# Patient Record
Sex: Male | Born: 1955
Health system: Southern US, Community
[De-identification: ages and names within clinical notes are randomized; demographics above are authoritative.]

## PROBLEM LIST (undated history)

## (undated) DIAGNOSIS — I10 Essential (primary) hypertension: Secondary | ICD-10-CM

## (undated) DIAGNOSIS — E785 Hyperlipidemia, unspecified: Secondary | ICD-10-CM

## (undated) DIAGNOSIS — B019 Varicella without complication: Secondary | ICD-10-CM

## (undated) DIAGNOSIS — Z8639 Personal history of other endocrine, nutritional and metabolic disease: Secondary | ICD-10-CM

## (undated) DIAGNOSIS — B269 Mumps without complication: Secondary | ICD-10-CM

## (undated) HISTORY — DX: Hyperlipidemia, unspecified: E78.5

## (undated) HISTORY — DX: Essential (primary) hypertension: I10

## (undated) HISTORY — DX: Varicella without complication: B01.9

## (undated) HISTORY — DX: Mumps without complication: B26.9

## (undated) HISTORY — DX: Personal history of other endocrine, nutritional and metabolic disease: Z86.39

---

## 1992-09-12 HISTORY — PX: WISDOM TOOTH EXTRACTION: SHX21

## 1996-09-12 HISTORY — PX: ROOT CANAL: SHX2363

## 2000-10-21 ENCOUNTER — Emergency Department (HOSPITAL_COMMUNITY): Admission: EM | Admit: 2000-10-21 | Discharge: 2000-10-21 | Payer: Self-pay | Admitting: Emergency Medicine

## 2000-10-26 ENCOUNTER — Emergency Department (HOSPITAL_COMMUNITY): Admission: EM | Admit: 2000-10-26 | Discharge: 2000-10-26 | Payer: Self-pay | Admitting: Emergency Medicine

## 2000-10-31 ENCOUNTER — Emergency Department (HOSPITAL_COMMUNITY): Admission: EM | Admit: 2000-10-31 | Discharge: 2000-10-31 | Payer: Self-pay | Admitting: Emergency Medicine

## 2002-12-22 ENCOUNTER — Emergency Department (HOSPITAL_COMMUNITY): Admission: EM | Admit: 2002-12-22 | Discharge: 2002-12-22 | Payer: Self-pay | Admitting: Emergency Medicine

## 2009-09-12 HISTORY — PX: COLONOSCOPY: SHX174

## 2011-04-08 ENCOUNTER — Ambulatory Visit (INDEPENDENT_AMBULATORY_CARE_PROVIDER_SITE_OTHER): Payer: BC Managed Care – PPO | Admitting: Family

## 2011-04-08 ENCOUNTER — Encounter: Payer: Self-pay | Admitting: Family

## 2011-04-08 DIAGNOSIS — Z862 Personal history of diseases of the blood and blood-forming organs and certain disorders involving the immune mechanism: Secondary | ICD-10-CM

## 2011-04-08 DIAGNOSIS — Z8639 Personal history of other endocrine, nutritional and metabolic disease: Secondary | ICD-10-CM | POA: Insufficient documentation

## 2011-04-08 DIAGNOSIS — I10 Essential (primary) hypertension: Secondary | ICD-10-CM

## 2011-04-08 LAB — BASIC METABOLIC PANEL
BUN: 17 mg/dL (ref 6–23)
Calcium: 9.5 mg/dL (ref 8.4–10.5)
Glucose, Bld: 89 mg/dL (ref 70–99)
Sodium: 141 mEq/L (ref 135–145)

## 2011-04-08 LAB — TSH: TSH: 1.372 u[IU]/mL (ref 0.350–4.500)

## 2011-04-08 NOTE — Assessment & Plan Note (Signed)
Will check TSH today. Apparently he has been euthyroid for the last 9 years per history.

## 2011-04-08 NOTE — Assessment & Plan Note (Signed)
He has not taken meds in 3 days. BP looks good.  He wishes to try to come off BP meds completely.  I recommended to him that he continue a low sodium diet, try to increase his exercise.  Follow up in 6 weeks for a complete physical and BP check.

## 2011-04-08 NOTE — Progress Notes (Signed)
  Subjective:    Patient ID: Joel Chen, male    DOB: 03-Feb-1956, 55 y.o.   MRN: [id number]  HPI  Mr.  Joel Chen is a 55 yr old male who presents today to establish care. Was followed at [facility].    Patient presents today for followup of hypertension.  Patient has been treated for Chronic HTN for quite sometime. He is currently on quinapril, though notes that he has not taken his medication in 3 days.   No associated S/S related to HTN. Quality: chronic Modifying factor: meds Duration: Quite sometime Associated S/S: None.  The patient denies the following associated symptoms: Chest pain, dyspnea, blurred vision, headache, or lower extremity edema.  Hyperthyroidism- was treated about 10 yrs ago.  ? On tapazole- for about a year.  Pt does not remember the name of the pill.  Had physical at [facility]    Review of Systems  Constitutional: Negative for fever and unexpected weight change.  HENT: Negative for hearing loss.   Eyes: Negative for visual disturbance.  Respiratory: Negative for shortness of breath.   Cardiovascular: Negative for chest pain and leg swelling.  Gastrointestinal: Negative for nausea, vomiting and diarrhea.  Genitourinary: Negative for dysuria, urgency and decreased urine volume.  Musculoskeletal: Negative for myalgias and arthralgias.  Skin: Negative for rash.       Mole on his side, unchanged  Neurological: Negative for headaches.  Hematological: Negative for adenopathy.  Psychiatric/Behavioral:       Denies depression or anxiety      Objective:   Physical Exam  Constitutional: He appears well-developed and well-nourished.  HENT:  Head: Normocephalic and atraumatic.  Eyes: Conjunctivae are normal. Pupils are equal, round, and reactive to light.  Neck: No tracheal deviation present. No thyromegaly present.  Cardiovascular: Normal rate and regular rhythm.   Pulmonary/Chest: Effort normal and breath sounds normal.  Abdominal: Soft. Bowel sounds  are normal. He exhibits no distension. There is no tenderness.  Musculoskeletal: He exhibits no edema.  Skin: Skin is warm and dry.  Psychiatric: He has a normal mood and affect. His behavior is normal. Judgment and thought content normal.          Assessment & Plan:

## 2011-04-08 NOTE — Patient Instructions (Addendum)
Please schedule a complete physical in 6 weeks. Come fasting to this appointment.

## 2011-04-10 ENCOUNTER — Encounter: Payer: Self-pay | Admitting: Family

## 2011-04-22 ENCOUNTER — Telehealth: Payer: Self-pay | Admitting: *Deleted

## 2011-04-22 NOTE — Telephone Encounter (Signed)
Received records from [facility] and forwarded to Provider for review.

## 2011-04-29 ENCOUNTER — Encounter: Payer: Self-pay | Admitting: Family

## 2011-04-29 DIAGNOSIS — E785 Hyperlipidemia, unspecified: Secondary | ICD-10-CM | POA: Insufficient documentation

## 2011-04-29 HISTORY — DX: Hyperlipidemia, unspecified: E78.5

## 2011-05-20 ENCOUNTER — Encounter: Payer: Self-pay | Admitting: Family

## 2011-05-20 ENCOUNTER — Ambulatory Visit (INDEPENDENT_AMBULATORY_CARE_PROVIDER_SITE_OTHER): Payer: BC Managed Care – PPO | Admitting: Family

## 2011-05-20 DIAGNOSIS — Z0000 Encounter for general adult medical examination without abnormal findings: Secondary | ICD-10-CM | POA: Insufficient documentation

## 2011-05-20 LAB — LIPID PANEL
Cholesterol: 251 mg/dL — ABNORMAL HIGH (ref 0–200)
HDL: 50 mg/dL (ref >39)
Total CHOL/HDL Ratio: 5 Ratio
Triglycerides: 131 mg/dL (ref <150)

## 2011-05-20 LAB — PSA: PSA: 2.03 ng/mL (ref <=4.00)

## 2011-05-20 LAB — CBC
Hemoglobin: 15.6 g/dL (ref 13.0–17.0)
RBC: 5.63 MIL/uL (ref 4.22–5.81)

## 2011-05-20 LAB — HEPATIC FUNCTION PANEL
Albumin: 4.4 g/dL (ref 3.5–5.2)
Total Protein: 7.4 g/dL (ref 6.0–8.3)

## 2011-05-20 MED ORDER — QUINAPRIL HCL 10 MG PO TABS: 10.0000 mg | ORAL_TABLET | ORAL | Status: DC

## 2011-05-20 NOTE — Patient Instructions (Signed)
Please complete your blood work on the first floor- we will mail you a letter about your results.   Follow up in 6 months. Try to exercise 30 minutes a day. Eat a low fat/low cholesterol diet.  Also try to limit your sodium.

## 2011-05-20 NOTE — Assessment & Plan Note (Signed)
Pt counseled on diet, exercise and weight loss.  Colo up to date,  Check fasting labs today.  Reports that his tetanus is up to date, declines flu shot today, he will get with his employer.

## 2011-05-20 NOTE — Progress Notes (Signed)
Subjective:    Patient ID: Joel Chen, male    DOB: July 28, 1956, 55 y.o.   MRN: [id number]  HPI  Mr.  Joel Chen is a 55 yr old male who presents today for a complete physical. Reports that his last tetanus was <10 yrs ago.  Will get flu shot from work. Colo was last year.  Normal.  Exercise-  Active with his job, wants to restart a formal exercise routine- has bike at home, treadmill, rowing machine.  Diet- AM (egg/toast/sausage juice (some days), dinner- "cuts back on a lot of stuff."     Review of Systems  Constitutional: Negative for fever and unexpected weight change.  HENT: Negative for hearing loss.   Eyes: Negative for visual disturbance.  Respiratory: Negative for shortness of breath.   Cardiovascular: Negative for chest pain, palpitations and leg swelling.  Gastrointestinal: Negative for nausea, vomiting and abdominal pain.  Genitourinary: Negative for dysuria and frequency.  Musculoskeletal: Negative for myalgias and arthralgias.  Skin: Negative for rash.  Neurological: Negative for headaches.  Hematological: Negative for adenopathy.  Psychiatric/Behavioral:       Denies depression or anxiety   Past Medical History  Diagnosis Date  . Hypertension   . Personal history of hyperthyroidism     "pt states his hyperthyroidism went away and hasn't had to take meds in 8 yrs."  . Hyperlipidemia 04/29/2011    History   Social History  . Marital Status: Married    Spouse Name: N/A    Number of Children: 2  . Years of Education: N/A   Occupational History  .  [employer]   Social History Main Topics  . Smoking status: Never Smoker   . Smokeless tobacco: Never Used  . Alcohol Use: 8.4 oz/week    14 Cans of beer per week  . Drug Use: No  . Sexually Active: Not on file   Other Topics Concern  . Not on file   Social History Narrative   Regular exercise:  Active at [facility] use: 1 cup coffee dailyLayout Engineer at [employer]     Past Surgical  History  Procedure Date  . Root canal 1998  . Wisdom tooth extraction 1994  . Colonoscopy 09/2009    hemorrhoids    Family History  Problem Relation Age of Onset  . Heart disease Mother   . Hypertension Mother   . Cancer Father     lung    No Known Allergies  Current Outpatient Prescriptions on File Prior to Visit  Medication Sig Dispense Refill  . clobetasol (TEMOVATE) 0.05 % ointment Apply 1 application topically 2 (two) times daily.          BP 130/90  Pulse 78  Temp(Src) 97.8 F (36.6 C) (Oral)  Resp 16  Ht 5\' 6"  (1.676 m)  Wt 210 lb (95.255 kg)  BMI 33.89 kg/m2       Objective:   Physical Exam  Constitutional: He is oriented to person, place, and time. He appears well-developed and well-nourished. No distress.  HENT:  Head: Normocephalic and atraumatic.  Right Ear: Tympanic membrane and ear canal normal.  Left Ear: Tympanic membrane and ear canal normal.  Eyes: Conjunctivae are normal. Pupils are equal, round, and reactive to light. Right eye exhibits no discharge.  Neck: Normal range of motion. Neck supple.  Cardiovascular: Normal rate, regular rhythm and normal heart sounds.  Exam reveals no friction rub.   No murmur heard. Pulmonary/Chest: Effort normal and breath sounds normal. No  respiratory distress. He has no wheezes. He has no rales. He exhibits no tenderness.  Abdominal: Soft. Bowel sounds are normal. He exhibits no distension and no mass. There is no tenderness. There is no rebound and no guarding.  Genitourinary: Prostate normal. Guaiac negative stool.  Neurological: He is alert and oriented to person, place, and time. He has normal strength.  Reflex Scores:      Tricep reflexes are 2+ on the right side and 2+ on the left side.      Bicep reflexes are 2+ on the right side and 2+ on the left side.      Brachioradialis reflexes are 2+ on the right side and 2+ on the left side.      Patellar reflexes are 2+ on the right side and 2+ on the left side.       Achilles reflexes are 2+ on the right side and 2+ on the left side. Skin: Skin is warm and dry.  Psychiatric: He has a normal mood and affect. His behavior is normal. Judgment and thought content normal.          Assessment & Plan:

## 2011-05-22 ENCOUNTER — Encounter: Payer: Self-pay | Admitting: Family

## 2011-11-22 ENCOUNTER — Encounter: Payer: Self-pay | Admitting: Family

## 2011-11-22 ENCOUNTER — Ambulatory Visit (INDEPENDENT_AMBULATORY_CARE_PROVIDER_SITE_OTHER): Payer: BC Managed Care – PPO | Admitting: Family

## 2011-11-22 VITALS — BP 132/88 | HR 84 | Temp 98.2°F | Resp 16 | Ht 66.0 in | Wt 214.0 lb

## 2011-11-22 DIAGNOSIS — L989 Disorder of the skin and subcutaneous tissue, unspecified: Secondary | ICD-10-CM

## 2011-11-22 DIAGNOSIS — Z8639 Personal history of other endocrine, nutritional and metabolic disease: Secondary | ICD-10-CM

## 2011-11-22 DIAGNOSIS — I10 Essential (primary) hypertension: Secondary | ICD-10-CM

## 2011-11-22 DIAGNOSIS — Z862 Personal history of diseases of the blood and blood-forming organs and certain disorders involving the immune mechanism: Secondary | ICD-10-CM

## 2011-11-22 DIAGNOSIS — E785 Hyperlipidemia, unspecified: Secondary | ICD-10-CM

## 2011-11-22 NOTE — Progress Notes (Signed)
  Subjective:    Patient ID: Joel Chen, male    DOB: 09/18/1955, 56 y.o.   MRN: [id number]  HPI  Joel Chen is a 56 yr old male who presents today for follow up.  1) HTN- he is now off of anti-hypertensives.  2) Hyperlipidemia- reports that he has cut down on greasy foods.    3) Skin lesion- reports that he was using a steroid cream for scalp lesion rx'd by his dermatologist.  This cream was not helping, and derm retired.  Notes that lesion itches on occasion, though feels ok to day.  4) Hyperthyroid- pt has normal TSH last visit.  Was treated with tapazole about 10 yrs ago.  Review of Systems    see HPI  Past Medical History  Diagnosis Date  . Hypertension   . Personal history of hyperthyroidism     "pt states his hyperthyroidism went away and hasn't had to take meds in 8 yrs."  . Hyperlipidemia 04/29/2011    History   Social History  . Marital Status: Married    Spouse Name: N/A    Number of Children: 2  . Years of Education: N/A   Occupational History  .  [employer]   Social History Main Topics  . Smoking status: Never Smoker   . Smokeless tobacco: Never Used  . Alcohol Use: 8.4 oz/week    14 Cans of beer per week  . Drug Use: No  . Sexually Active: Not on file   Other Topics Concern  . Not on file   Social History Narrative   Regular exercise:  Active at [facility] use: 1 cup coffee dailyLayout Engineer at [employer]     Past Surgical History  Procedure Date  . Root canal 1998  . Wisdom tooth extraction 1994  . Colonoscopy 09/2009    hemorrhoids    Family History  Problem Relation Age of Onset  . Heart disease Mother   . Hypertension Mother   . Cancer Father     lung    No Known Allergies  No current outpatient prescriptions on file prior to visit.    BP 132/88  Pulse 84  Temp(Src) 98.2 F (36.8 C) (Oral)  Resp 16  Ht 5\' 6"  (1.676 m)  Wt 214 lb (97.07 kg)  BMI 34.54 kg/m2  SpO2 97%    Objective:   Physical  Exam  Constitutional: He appears well-developed and well-nourished. No distress.  Cardiovascular: Normal rate and regular rhythm.   No murmur heard. Pulmonary/Chest: Effort normal and breath sounds normal. No respiratory distress. He has no wheezes. He has no rales. He exhibits no tenderness.  Musculoskeletal: He exhibits no edema.  Skin:       1 cm flat, flesh colored lesion at base of scalp- no hair growing in this patch.   Psychiatric: He has a normal mood and affect. His behavior is normal. Judgment and thought content normal.          Assessment & Plan:

## 2011-11-22 NOTE — Patient Instructions (Signed)
Please return fasting for cholesterol check to lab.  Follow up in late September for physical.

## 2011-11-23 DIAGNOSIS — L989 Disorder of the skin and subcutaneous tissue, unspecified: Secondary | ICD-10-CM | POA: Insufficient documentation

## 2011-11-23 NOTE — Assessment & Plan Note (Signed)
BP is stable off of ACE.  Continue low sodium diet, recommended exercise and weight loss as well.

## 2011-11-23 NOTE — Assessment & Plan Note (Signed)
Will refer to dermatology

## 2011-11-23 NOTE — Assessment & Plan Note (Signed)
TSH normal last visit.  Clinically euthyroid. Monitor.

## 2011-11-23 NOTE — Assessment & Plan Note (Signed)
He will return for FLP.

## 2011-11-30 LAB — LIPID PANEL
Cholesterol: 218 mg/dL — ABNORMAL HIGH (ref 0–200)
Total CHOL/HDL Ratio: 5.2 Ratio
Triglycerides: 184 mg/dL — ABNORMAL HIGH (ref <150)

## 2011-12-01 ENCOUNTER — Encounter: Payer: Self-pay | Admitting: Family

## 2012-05-31 ENCOUNTER — Telehealth: Payer: Self-pay | Admitting: *Deleted

## 2012-05-31 NOTE — Telephone Encounter (Signed)
Received message from pt's wife stating pt has physical on 06/04/12 at 3pm. Pt will be unable to fast all day and wanted to know if he will need to have labs done at upcoming appt. Attempted to reach pt and left detailed message on home# that he may return to the lab fasting either before or after his physical appt. And to call and let us know how he would like to proceed.

## 2012-06-04 ENCOUNTER — Encounter: Payer: Self-pay | Admitting: Family

## 2012-06-04 ENCOUNTER — Ambulatory Visit (INDEPENDENT_AMBULATORY_CARE_PROVIDER_SITE_OTHER): Payer: BC Managed Care – PPO | Admitting: Family

## 2012-06-04 ENCOUNTER — Encounter: Payer: BC Managed Care – PPO | Admitting: Family

## 2012-06-04 VITALS — BP 140/88 | HR 55 | Temp 98.9°F | Resp 16 | Ht 67.5 in | Wt 215.0 lb

## 2012-06-04 DIAGNOSIS — Z23 Encounter for immunization: Secondary | ICD-10-CM

## 2012-06-04 DIAGNOSIS — Z0000 Encounter for general adult medical examination without abnormal findings: Secondary | ICD-10-CM

## 2012-06-04 LAB — LIPID PANEL: LDL Cholesterol: 153 mg/dL — ABNORMAL HIGH (ref 0–99)

## 2012-06-04 LAB — HEPATIC FUNCTION PANEL
ALT: 28 U/L (ref 0–53)
Alkaline Phosphatase: 73 U/L (ref 39–117)
Indirect Bilirubin: 0.7 mg/dL (ref 0.0–0.9)
Total Protein: 6.9 g/dL (ref 6.0–8.3)

## 2012-06-04 LAB — PSA: PSA: 2.06 ng/mL (ref <=4.00)

## 2012-06-04 LAB — BASIC METABOLIC PANEL WITH GFR
BUN: 14 mg/dL (ref 6–23)
Chloride: 103 mEq/L (ref 96–112)
Creat: 1.11 mg/dL (ref 0.50–1.35)
GFR, Est African American: 87 mL/min
Glucose, Bld: 90 mg/dL (ref 70–99)
Potassium: 4 mEq/L (ref 3.5–5.3)

## 2012-06-04 LAB — TSH: TSH: 1.282 u[IU]/mL (ref 0.350–4.500)

## 2012-06-04 NOTE — Assessment & Plan Note (Addendum)
Patient was encouraged to continue his exercise with the goal of 30 minutes 5 days a week. Obtain fasting lab work flu shot today. We discussed pros/cons to PSA screening and he would like to proceed. EKG performed today notes sinus bradycarda.

## 2012-06-04 NOTE — Patient Instructions (Addendum)
Please complete your lab work prior to leaving. Follow up in 6 months.  

## 2012-06-04 NOTE — Progress Notes (Signed)
Subjective:    Patient ID: Joel Chen, male    DOB: [birth year], 56 y.o.   MRN: [id number]  HPI  Preventative-  Reports that he has started walking push ups etc.  He has a bicycle and treadmill.  He has cut back portions and greasy foods.  Wants flu shot.    Review of Systems  Constitutional: Negative for unexpected weight change.  HENT: Negative for hearing loss and congestion.   Eyes: Negative for visual disturbance.  Respiratory: Negative for cough and shortness of breath.   Cardiovascular: Negative for chest pain.  Gastrointestinal: Negative for nausea, vomiting and diarrhea.  Genitourinary: Negative for urgency and frequency.  Musculoskeletal: Negative for myalgias and back pain.  Skin: Negative for rash.  Neurological: Negative for headaches.  Hematological: Negative for adenopathy.  Psychiatric/Behavioral:       Denies depression/anxiety   Past Medical History  Diagnosis Date  . Hypertension   . Personal history of hyperthyroidism     "pt states his hyperthyroidism went away and hasn't had to take meds in 8 yrs."  . Hyperlipidemia 04/29/2011    History   Social History  . Marital Status: Married    Spouse Name: N/A    Number of Children: 2  . Years of Education: N/A   Occupational History  .  [employer]   Social History Main Topics  . Smoking status: Never Smoker   . Smokeless tobacco: Never Used  . Alcohol Use: 3.6 oz/week    6 Cans of beer per week  . Drug Use: No  . Sexually Active: Not on file   Other Topics Concern  . Not on file   Social History Narrative   Regular exercise:  Active at work. 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 Root canal 1998  . Wisdom tooth extraction 1994  . Colonoscopy 09/2009    hemorrhoids    Family History  Problem Relation Age of Onset  . Heart disease Mother   . Hypertension Mother   . Cancer Father    lung    No Known Allergies  Current Outpatient Prescriptions on File Prior to Visit  Medication Sig Dispense Refill  . DISCONTD: quinapril (ACCUPRIL) 10 MG tablet Take 1 tablet (10 mg total) by mouth every morning.  30 tablet  5    BP 140/88  Pulse 55  Temp 98.9 F (37.2 C) (Oral)  Resp 16  Ht 5' 7.5" (1.715 m)  Wt 215 lb (97.523 kg)  BMI 33.18 kg/m2  SpO2 98%        Objective:   Physical Exam  Physical Exam  Constitutional: He is oriented to person, place, and time. He appears well-developed and well-nourished. No distress. Overweight african [place] male.   HENT:  Head: Normocephalic and atraumatic.  Right Ear: Tympanic membrane and ear canal normal.  Left Ear: Tympanic membrane and ear canal normal.  Mouth/Throat: Oropharynx is clear and moist.  Eyes: Pupils are equal, round, and reactive to light. No scleral icterus.  Neck: Normal range of motion. No thyromegaly present.  Cardiovascular: Normal rate and regular rhythm.   No murmur heard. Pulmonary/Chest: Effort normal and breath sounds normal. No respiratory distress. He has no wheezes. He has no rales. He exhibits no tenderness.  Abdominal: Soft. Bowel sounds are normal. He exhibits no distension and no mass. There is no tenderness. There is no rebound and no  guarding.  Musculoskeletal: He exhibits no edema.  Lymphadenopathy:    He has no cervical adenopathy.  Neurological: He is alert and oriented to person, place, and time. He has normal reflexes. He exhibits normal muscle tone. Coordination normal.  Skin: Skin is warm and dry.  Psychiatric: He has a normal mood and affect. His behavior is normal. Judgment and thought content normal.  GU:  Prostate is smooth, no enlargement. Heme negative stool.        Assessment & Plan:         Assessment & Plan:

## 2012-06-05 LAB — URINALYSIS, ROUTINE W REFLEX MICROSCOPIC
Leukocytes, UA: NEGATIVE
Nitrite: NEGATIVE
Specific Gravity, Urine: 1.01 (ref 1.005–1.030)
Urobilinogen, UA: 0.2 mg/dL (ref 0.0–1.0)

## 2012-06-07 ENCOUNTER — Telehealth: Payer: Self-pay | Admitting: Family

## 2012-06-07 DIAGNOSIS — E785 Hyperlipidemia, unspecified: Secondary | ICD-10-CM

## 2012-06-07 MED ORDER — SIMVASTATIN 10 MG PO TABS: 10.0000 mg | ORAL_TABLET | Freq: Every day | ORAL | Status: DC

## 2012-06-07 NOTE — Telephone Encounter (Signed)
Pls call pt and let him know that his triglycerides are improved, but his total cholesterol and LDL (bad cholesterol) remain elevated.  I would recommend a low dose statin to help decrease risk of heart attack and stroke.  Simvastatin sent to pharm.  He will need flp/lft in 6 weeks please (hyperlipidemia).  I would also like him to add a baby aspirin 81 mg once daily to decrease risk of heart attack/stroke.

## 2012-06-08 NOTE — Telephone Encounter (Signed)
LMOM with contact name & number for return call RE: results & further instructions from physician, informed to begin ASA 81mg  daily/SLS

## 2012-06-11 NOTE — Telephone Encounter (Signed)
Notified pt and he voices understanding. Future lab order placed. Copy given to the lab and mailed to pt as reminder.

## 2012-06-25 ENCOUNTER — Telehealth: Payer: Self-pay | Admitting: *Deleted

## 2012-06-25 DIAGNOSIS — E785 Hyperlipidemia, unspecified: Secondary | ICD-10-CM

## 2012-06-25 NOTE — Telephone Encounter (Signed)
Pt's wife called requesting a call back; she has a question regarding pt's medication prescribed.  Returned call [phone] at 4:03 pm left vm to return call.

## 2012-06-25 NOTE — Telephone Encounter (Signed)
Pt's wife called requesting a call back; she has a question regarding pt's medication prescribed.

## 2012-06-26 NOTE — Telephone Encounter (Signed)
Attempted to reach pt's wife, Margaretha Glassing. Left message that I will call her back tomorrow.

## 2012-06-27 NOTE — Telephone Encounter (Signed)
Attempted to reach [place] and left message to return my call.

## 2012-06-27 NOTE — Telephone Encounter (Signed)
OK, he should repeat flp in 3 months.

## 2012-06-27 NOTE — Telephone Encounter (Signed)
Spoke with pt's wife. She states that pt is concerned about possible side effects of cholesterol medication and wants to try diet and exercise first. Pt's wife states she is cooking healthier meals and they are walking daily. Pt wants to try this first and repeat labs in 6 weeks. Pt is taking ASA as instructed.  Please advise.

## 2012-07-02 NOTE — Telephone Encounter (Signed)
Left detailed message on home # re: instructions below. Lab order entered for FLP around the 2nd week of January. Copy given to the lab and mailed to pt. Advised pt to call if any questions.

## 2013-01-02 ENCOUNTER — Ambulatory Visit: Payer: BC Managed Care – PPO | Admitting: Family

## 2013-07-08 ENCOUNTER — Encounter: Payer: BC Managed Care – PPO | Admitting: Family

## 2013-07-16 ENCOUNTER — Encounter: Payer: BC Managed Care – PPO | Admitting: Family

## 2013-10-21 ENCOUNTER — Telehealth: Payer: Self-pay | Admitting: Family

## 2013-10-21 NOTE — Telephone Encounter (Signed)
That is fine with me.  His first appointment will need to be a 30-minute transfer of care appointment.  If he is wanting a complete physical, I can do that at the same time.  Therefore, he should come to appointment fasting for labs.

## 2013-10-21 NOTE — Telephone Encounter (Signed)
[facility] would [facility] to [facility], PA-C. Is this ok? Thanks!

## 2013-10-21 NOTE — Telephone Encounter (Signed)
OK with me.

## 2013-10-22 NOTE — Telephone Encounter (Signed)
Informed patient of wife that it is okay to transfer per both providers. She states that she will call patient and have him call to schedule appointment

## 2013-10-28 ENCOUNTER — Encounter: Payer: BC Managed Care – PPO | Admitting: Family

## 2013-10-31 ENCOUNTER — Encounter: Payer: BC Managed Care – PPO | Admitting: Physician Assistant

## 2013-11-13 ENCOUNTER — Ambulatory Visit (INDEPENDENT_AMBULATORY_CARE_PROVIDER_SITE_OTHER): Payer: BC Managed Care – PPO | Admitting: Physician Assistant

## 2013-11-13 ENCOUNTER — Encounter: Payer: Self-pay | Admitting: Physician Assistant

## 2013-11-13 VITALS — BP 134/86 | HR 78 | Temp 98.1°F | Resp 16 | Ht 66.0 in | Wt 209.5 lb

## 2013-11-13 DIAGNOSIS — Z0000 Encounter for general adult medical examination without abnormal findings: Secondary | ICD-10-CM

## 2013-11-13 DIAGNOSIS — E785 Hyperlipidemia, unspecified: Secondary | ICD-10-CM

## 2013-11-13 DIAGNOSIS — Z862 Personal history of diseases of the blood and blood-forming organs and certain disorders involving the immune mechanism: Secondary | ICD-10-CM

## 2013-11-13 DIAGNOSIS — I10 Essential (primary) hypertension: Secondary | ICD-10-CM

## 2013-11-13 DIAGNOSIS — Z8639 Personal history of other endocrine, nutritional and metabolic disease: Secondary | ICD-10-CM

## 2013-11-13 LAB — BASIC METABOLIC PANEL
BUN: 15 mg/dL (ref 6–23)
CHLORIDE: 104 meq/L (ref 96–112)
CO2: 29 meq/L (ref 19–32)
CREATININE: 1.07 mg/dL (ref 0.50–1.35)
Calcium: 9.3 mg/dL (ref 8.4–10.5)
GLUCOSE: 94 mg/dL (ref 70–99)
POTASSIUM: 4.6 meq/L (ref 3.5–5.3)
Sodium: 142 mEq/L (ref 135–145)

## 2013-11-13 LAB — HEPATIC FUNCTION PANEL
ALBUMIN: 4.4 g/dL (ref 3.5–5.2)
ALT: 42 U/L (ref 0–53)
AST: 29 U/L (ref 0–37)
Alkaline Phosphatase: 70 U/L (ref 39–117)
Bilirubin, Direct: 0.1 mg/dL (ref 0.0–0.3)
Indirect Bilirubin: 0.6 mg/dL (ref 0.2–1.2)
TOTAL PROTEIN: 6.6 g/dL (ref 6.0–8.3)
Total Bilirubin: 0.7 mg/dL (ref 0.2–1.2)

## 2013-11-13 LAB — CBC WITH DIFFERENTIAL/PLATELET
BASOS ABS: 0 10*3/uL (ref 0.0–0.1)
Basophils Relative: 0 % (ref 0–1)
EOS PCT: 2 % (ref 0–5)
Eosinophils Absolute: 0.1 10*3/uL (ref 0.0–0.7)
HEMATOCRIT: 44.2 % (ref 39.0–52.0)
HEMOGLOBIN: 15.3 g/dL (ref 13.0–17.0)
LYMPHS PCT: 31 % (ref 12–46)
Lymphs Abs: 1.6 10*3/uL (ref 0.7–4.0)
MCH: 28.2 pg (ref 26.0–34.0)
MCHC: 34.6 g/dL (ref 30.0–36.0)
MCV: 81.5 fL (ref 78.0–100.0)
MONO ABS: 0.5 10*3/uL (ref 0.1–1.0)
MONOS PCT: 9 % (ref 3–12)
NEUTROS ABS: 3 10*3/uL (ref 1.7–7.7)
Neutrophils Relative %: 58 % (ref 43–77)
Platelets: 256 10*3/uL (ref 150–400)
RBC: 5.42 MIL/uL (ref 4.22–5.81)
RDW: 15.2 % (ref 11.5–15.5)
WBC: 5.1 10*3/uL (ref 4.0–10.5)

## 2013-11-13 LAB — HEMOGLOBIN A1C
HEMOGLOBIN A1C: 5.5 % (ref <5.7)
Mean Plasma Glucose: 111 mg/dL (ref <117)

## 2013-11-13 LAB — LIPID PANEL
Cholesterol: 218 mg/dL — ABNORMAL HIGH (ref 0–200)
HDL: 48 mg/dL (ref >39)
LDL Cholesterol: 152 mg/dL — ABNORMAL HIGH (ref 0–99)
TRIGLYCERIDES: 89 mg/dL (ref <150)
Total CHOL/HDL Ratio: 4.5 Ratio
VLDL: 18 mg/dL (ref 0–40)

## 2013-11-13 LAB — TSH: TSH: 1.76 u[IU]/mL (ref 0.350–4.500)

## 2013-11-13 NOTE — Progress Notes (Signed)
Patient presents to clinic today to trasfer care and for CPE.  Acute Concerns: No acute concerns  Chronic Issues: Hypertension -- 134/86.  Patient denies HA, blurry vision, chest pain, palpitations, SOB, LH or dizziness. HTN has been controlled with diet and exercise  Hyperlipidemia -- Was on Zocor in the past.  Has not taken in 2+ years.  Is taking a fish oil supplement daily.  Is fasting for labs.  Overdue for lipid panel.  Health Maintenance: Dental -- 2014 Vision -- 2014 Immunizations -- Flu shot and Tetanus UTD; Tetanus due in 2018 Colonoscopy -- UTD; due in 2021  Past Medical History  Diagnosis Date  . Hypertension   . Personal history of hyperthyroidism     "pt states his hyperthyroidism went away and hasn't had to take meds in 8 yrs."  . Hyperlipidemia 04/29/2011  . Chicken pox   . Mumps     Past Surgical History  Procedure Laterality Date  . Root canal  1998  . Wisdom tooth extraction  1994  . Colonoscopy  09/2009    hemorrhoids    Current Outpatient Prescriptions on File Prior to Visit  Medication Sig Dispense Refill  . aspirin EC 81 MG tablet Take 81 mg by mouth daily.      . [DISCONTINUED] quinapril (ACCUPRIL) 10 MG tablet Take 1 tablet (10 mg total) by mouth every morning.  30 tablet  5   No current facility-administered medications on file prior to visit.    No Known Allergies  Family History  Problem Relation Age of Onset  . Heart disease Mother     Living  . Hypertension Mother   . Cancer Father 11    lung-Deceased  . Dementia Maternal Grandfather   . Heart disease Maternal Aunt   . Hypertension Maternal Aunt   . Other Brother     Hyperglycemia/Pre-diabetes  . Healthy Sister     x2  . Healthy Brother     x1 [#2]  . Allergies Daughter   . Healthy Daughter     x1 [#2]    History   Social History  . Marital Status: Married    Spouse Name: N/A    Number of Children: 2  . Years of Education: N/A   Occupational History  .  [employer] Main Topics  . Smoking status: Never Smoker   . Smokeless tobacco: Never Used  . Alcohol Use: 3.6 oz/week    6 Cans of beer per week  . Drug Use: No  . Sexual Activity: Yes    Partners: Female   Other Topics Concern  . Not on file   Social History Narrative   Regular exercise:  Active at work. Walks 2 x weekly.   Caffeine use: 1 cup coffee daily   Mining engineer at [employer]  Constitutional: Negative for fever and weight loss.  HENT: Negative for hearing loss and tinnitus.   Eyes: Negative for blurred vision and double vision.  Respiratory: Negative for cough, shortness of breath and wheezing.   Cardiovascular: Negative for chest pain and palpitations.  Gastrointestinal: Positive for heartburn. Negative for nausea, vomiting, abdominal pain, diarrhea, constipation, blood in stool and melena.  Genitourinary: Positive for frequency. Negative for dysuria, urgency, hematuria and flank pain.       Denies hesitancy, post-void dribbling.  Nocturia x 0.    Neurological: Negative  for dizziness, seizures, loss of consciousness and headaches.  Psychiatric/Behavioral: Negative for depression, suicidal ideas, hallucinations and substance abuse. The patient is not nervous/anxious and does not have insomnia.    BP 134/86  Pulse 78  Temp(Src) 98.1 F (36.7 C) (Oral)  Resp 16  Ht $R'5\' 6"'vc$  (1.676 m)  Wt 209 lb 8 oz (95.029 kg)  BMI 33.83 kg/m2  SpO2 98%  Physical Exam  Vitals reviewed. Constitutional: He is oriented to person, place, and time and well-developed, well-nourished, and in no distress.  HENT:  Head: Normocephalic and atraumatic.  Right Ear: External ear normal.  Left Ear: External ear normal.  Nose: Nose normal.  Mouth/Throat: Oropharynx is clear and moist. No oropharyngeal exudate.  TM within normal limits   Eyes: Conjunctivae and EOM are normal. Pupils are equal, round, and reactive to light.  Neck: Neck  supple.  Cardiovascular: Normal rate, regular rhythm, normal heart sounds and intact distal pulses.   Pulmonary/Chest: Effort normal and breath sounds normal. No respiratory distress. He has no wheezes. He has no rales. He exhibits no tenderness.  Abdominal: Soft. Bowel sounds are normal. He exhibits no distension and no mass. There is no tenderness. There is no rebound and no guarding.  Genitourinary:  Defers GU exam.  Lymphadenopathy:    He has no cervical adenopathy.  Neurological: He is alert and oriented to person, place, and time.  Skin: Skin is warm and dry. No rash noted.  Psychiatric: Affect normal.    Recent Results (from the past 2160 hour(s))  CBC WITH DIFFERENTIAL     Status: None   Collection Time    11/13/13  9:24 AM      Result Value Ref Range   WBC 5.1  4.0 - 10.5 K/uL   RBC 5.42  4.22 - 5.81 MIL/uL   Hemoglobin 15.3  13.0 - 17.0 g/dL   HCT 44.2  39.0 - 52.0 %   MCV 81.5  78.0 - 100.0 fL   MCH 28.2  26.0 - 34.0 pg   MCHC 34.6  30.0 - 36.0 g/dL   RDW 15.2  11.5 - 15.5 %   Platelets 256  150 - 400 K/uL   Neutrophils Relative % 58  43 - 77 %   Neutro Abs 3.0  1.7 - 7.7 K/uL   Lymphocytes Relative 31  12 - 46 %   Lymphs Abs 1.6  0.7 - 4.0 K/uL   Monocytes Relative 9  3 - 12 %   Monocytes Absolute 0.5  0.1 - 1.0 K/uL   Eosinophils Relative 2  0 - 5 %   Eosinophils Absolute 0.1  0.0 - 0.7 K/uL   Basophils Relative 0  0 - 1 %   Basophils Absolute 0.0  0.0 - 0.1 K/uL   Smear Review Criteria for review not met    BASIC METABOLIC PANEL     Status: None   Collection Time    11/13/13  9:24 AM      Result Value Ref Range   Sodium 142  135 - 145 mEq/L   Potassium 4.6  3.5 - 5.3 mEq/L   Chloride 104  96 - 112 mEq/L   CO2 29  19 - 32 mEq/L   Glucose, Bld 94  70 - 99 mg/dL   BUN 15  6 - 23 mg/dL   Creat 1.07  0.50 - 1.35 mg/dL   Calcium 9.3  8.4 - 10.5 mg/dL  HEPATIC FUNCTION PANEL     Status: None  Collection Time    11/13/13  9:24 AM      Result Value Ref Range    Total Bilirubin 0.7  0.2 - 1.2 mg/dL   Bilirubin, Direct 0.1  0.0 - 0.3 mg/dL   Indirect Bilirubin 0.6  0.2 - 1.2 mg/dL   Alkaline Phosphatase 70  39 - 117 U/L   AST 29  0 - 37 U/L   ALT 42  0 - 53 U/L   Total Protein 6.6  6.0 - 8.3 g/dL   Albumin 4.4  3.5 - 5.2 g/dL  TSH     Status: None   Collection Time    11/13/13  9:24 AM      Result Value Ref Range   TSH 1.760  0.350 - 4.500 uIU/mL  HEMOGLOBIN A1C     Status: None   Collection Time    11/13/13  9:24 AM      Result Value Ref Range   Hemoglobin A1C 5.5  <5.7 %   Comment:                                                                            According to the ADA Clinical Practice Recommendations for 2011, when     HbA1c is used as a screening test:             >=6.5%   Diagnostic of Diabetes Mellitus                (if abnormal result is confirmed)           5.7-6.4%   Increased risk of developing Diabetes Mellitus           References:Diagnosis and Classification of Diabetes Mellitus,Diabetes     FFMB,8466,59(DJTTS 1):S62-S69 and Standards of Medical Care in             Diabetes - 2011,Diabetes Care,2011,34 (Suppl 1):S11-S61.         Mean Plasma Glucose 111  <117 mg/dL  URINALYSIS, ROUTINE W REFLEX MICROSCOPIC     Status: None   Collection Time    11/13/13  9:24 AM      Result Value Ref Range   Color, Urine YELLOW  YELLOW   APPearance CLEAR  CLEAR   Specific Gravity, Urine 1.008  1.005 - 1.030   pH 6.0  5.0 - 8.0   Glucose, UA NEG  NEG mg/dL   Bilirubin Urine NEG  NEG   Ketones, ur NEG  NEG mg/dL   Hgb urine dipstick NEG  NEG   Protein, ur NEG  NEG mg/dL   Urobilinogen, UA 0.2  0.0 - 1.0 mg/dL   Nitrite NEG  NEG   Leukocytes, UA NEG  NEG  PSA     Status: None   Collection Time    11/13/13  9:24 AM      Result Value Ref Range   PSA 2.07  <=4.00 ng/mL   Comment: Test Methodology: ECLIA PSA (Electrochemiluminescence Immunoassay)           For PSA values from 2.5-4.0, particularly in younger men <60 years      old, the AUA and NCCN suggest testing for % Free PSA (3515) and  evaluation of the rate of increase in PSA (PSA velocity).  LIPID PANEL     Status: Abnormal   Collection Time    11/13/13  9:24 AM      Result Value Ref Range   Cholesterol 218 (*) 0 - 200 mg/dL   Comment: ATP III Classification:           < 200        mg/dL        Desirable          200 - 239     mg/dL        Borderline High          >= 240        mg/dL        High         Triglycerides 89  <150 mg/dL   HDL 48  >39 mg/dL   Total CHOL/HDL Ratio 4.5     VLDL 18  0 - 40 mg/dL   LDL Cholesterol 152 (*) 0 - 99 mg/dL   Comment:       Total Cholesterol/HDL Ratio:CHD Risk                            Coronary Heart Disease Risk Table                                            Men       Women              1/2 Average Risk              3.4        3.3                  Average Risk              5.0        4.4               2X Average Risk              9.6        7.1               3X Average Risk             23.4       11.0     Use the calculated Patient Ratio above and the CHD Risk table      to determine the patient's CHD Risk.     ATP III Classification (LDL):           < 100        mg/dL         Optimal          100 - 129     mg/dL         Near or Above Optimal          130 - 159     mg/dL         Borderline High          160 - 189     mg/dL         High           > 190        mg/dL  Very High          Assessment/Plan: HTN (hypertension) BP stable without medication.  Continue TLC.  Will continue to monitor.  Personal history of hyperthyroidism Will recheck TSH.  Patient euthyroid at last check.  Hyperlipidemia Patient not taken medication in several years.  Will obtain fasting lipid profile.  Visit for preventive health examination Health maintenance UTD. Reviewed and updated history.  Will obtain fasting labs.

## 2013-11-13 NOTE — Progress Notes (Signed)
Pre visit review using our clinic review tool, if applicable. No additional management support is needed unless otherwise documented below in the visit note/SLS  

## 2013-11-13 NOTE — Patient Instructions (Signed)
Please obtain labs. I will call you with your results.  Please continue Aspirin 81 mg daily.  Read information below on the DASH diet to help keep your blood pressure in the normal range.  We will schedule follow-up according to your lab results.  DASH Diet The DASH diet stands for "Dietary Approaches to Stop Hypertension." It is a healthy eating plan that has been shown to reduce high blood pressure (hypertension) in as little as 14 days, while also possibly providing other significant health benefits. These other health benefits include reducing the risk of breast cancer after menopause and reducing the risk of type 2 diabetes, heart disease, colon cancer, and stroke. Health benefits also include weight loss and slowing kidney failure in patients with chronic kidney disease.  DIET GUIDELINES  Limit salt (sodium). Your diet should contain less than 1500 mg of sodium daily.  Limit refined or processed carbohydrates. Your diet should include mostly whole grains. Desserts and added sugars should be used sparingly.  Include small amounts of heart-healthy fats. These types of fats include nuts, oils, and tub margarine. Limit saturated and trans fats. These fats have been shown to be harmful in the body. CHOOSING FOODS  The following food groups are based on a 2000 calorie diet. See your Registered Dietitian for individual calorie needs. Grains and Grain Products (6 to 8 servings daily)  Eat More Often: Whole-wheat bread, brown rice, whole-grain or wheat pasta, quinoa, popcorn without added fat or salt (air popped).  Eat Less Often: White bread, white pasta, white rice, cornbread. Vegetables (4 to 5 servings daily)  Eat More Often: Fresh, frozen, and canned vegetables. Vegetables may be raw, steamed, roasted, or grilled with a minimal amount of fat.  Eat Less Often/Avoid: Creamed or fried vegetables. Vegetables in a cheese sauce. Fruit (4 to 5 servings daily)  Eat More Often: All fresh, canned  (in natural juice), or frozen fruits. Dried fruits without added sugar. One hundred percent fruit juice ( cup [237 mL] daily).  Eat Less Often: Dried fruits with added sugar. Canned fruit in light or heavy syrup. Foot LockerLean Meats, Fish, and Poultry (2 servings or less daily. One serving is 3 to 4 oz [85-114 g]).  Eat More Often: Ninety percent or leaner ground beef, tenderloin, sirloin. Round cuts of beef, chicken breast, [place]key breast. All fish. Grill, bake, or broil your meat. Nothing should be fried.  Eat Less Often/Avoid: Fatty cuts of meat, [place]key, or chicken leg, thigh, or wing. Fried cuts of meat or fish. Dairy (2 to 3 servings)  Eat More Often: Low-fat or fat-free milk, low-fat plain or light yogurt, reduced-fat or part-skim cheese.  Eat Less Often/Avoid: Milk (whole, 2%).Whole milk yogurt. Full-fat cheeses. Nuts, Seeds, and Legumes (4 to 5 servings per week)  Eat More Often: All without added salt.  Eat Less Often/Avoid: Salted nuts and seeds, canned beans with added salt. Fats and Sweets (limited)  Eat More Often: Vegetable oils, tub margarines without trans fats, sugar-free gelatin. Mayonnaise and salad dressings.  Eat Less Often/Avoid: Coconut oils, palm oils, butter, stick margarine, cream, half and half, cookies, candy, pie. FOR MORE INFORMATION The Dash Diet Eating Plan: www.dashdiet.org Document Released: 08/18/2011 Document Revised: 11/21/2011 Document Reviewed: 08/18/2011 [facility] [place] [facility] [employer] Patient Information 2014 [place], [place].

## 2013-11-14 LAB — URINALYSIS, ROUTINE W REFLEX MICROSCOPIC
Bilirubin Urine: NEGATIVE
Glucose, UA: NEGATIVE mg/dL
HGB URINE DIPSTICK: NEGATIVE
KETONES UR: NEGATIVE mg/dL
LEUKOCYTES UA: NEGATIVE
Nitrite: NEGATIVE
PH: 6 (ref 5.0–8.0)
PROTEIN: NEGATIVE mg/dL
Specific Gravity, Urine: 1.008 (ref 1.005–1.030)
UROBILINOGEN UA: 0.2 mg/dL (ref 0.0–1.0)

## 2013-11-14 LAB — PSA: PSA: 2.07 ng/mL (ref <=4.00)

## 2013-11-17 DIAGNOSIS — Z0000 Encounter for general adult medical examination without abnormal findings: Secondary | ICD-10-CM | POA: Insufficient documentation

## 2013-11-17 NOTE — Assessment & Plan Note (Signed)
Patient not taken medication in several years.  Will obtain fasting lipid profile.

## 2013-11-17 NOTE — Assessment & Plan Note (Signed)
Health maintenance UTD. Reviewed and updated history.  Will obtain fasting labs.

## 2013-11-17 NOTE — Assessment & Plan Note (Signed)
BP stable without medication.  Continue TLC.  Will continue to monitor.

## 2013-11-17 NOTE — Assessment & Plan Note (Signed)
Will recheck TSH.  Patient euthyroid at last check.

## 2014-06-11 ENCOUNTER — Ambulatory Visit (HOSPITAL_BASED_OUTPATIENT_CLINIC_OR_DEPARTMENT_OTHER)
Admission: RE | Admit: 2014-06-11 | Discharge: 2014-06-11 | Disposition: A | Discharge status: Home / Self Care | Payer: BC Managed Care – PPO | Source: Ambulatory Visit | Attending: Physician Assistant | Admitting: Physician Assistant

## 2014-06-11 ENCOUNTER — Encounter: Payer: Self-pay | Admitting: Physician Assistant

## 2014-06-11 ENCOUNTER — Ambulatory Visit: Payer: BC Managed Care – PPO | Admitting: Family

## 2014-06-11 ENCOUNTER — Ambulatory Visit (INDEPENDENT_AMBULATORY_CARE_PROVIDER_SITE_OTHER): Payer: BC Managed Care – PPO | Admitting: Physician Assistant

## 2014-06-11 VITALS — BP 138/80 | HR 82 | Temp 98.5°F | Resp 16 | Ht 66.0 in | Wt 198.1 lb

## 2014-06-11 DIAGNOSIS — M67911 Unspecified disorder of synovium and tendon, right shoulder: Secondary | ICD-10-CM

## 2014-06-11 DIAGNOSIS — M25519 Pain in unspecified shoulder: Secondary | ICD-10-CM | POA: Insufficient documentation

## 2014-06-11 DIAGNOSIS — M6790 Unspecified disorder of synovium and tendon, unspecified site: Secondary | ICD-10-CM

## 2014-06-11 DIAGNOSIS — M67919 Unspecified disorder of synovium and tendon, unspecified shoulder: Secondary | ICD-10-CM | POA: Insufficient documentation

## 2014-06-11 DIAGNOSIS — M719 Bursopathy, unspecified: Secondary | ICD-10-CM

## 2014-06-11 MED ORDER — MELOXICAM 15 MG PO TABS: 15.0000 mg | ORAL_TABLET | Freq: Every day | ORAL | Status: DC

## 2014-06-11 NOTE — Assessment & Plan Note (Signed)
Will obtain x-ray of right shoulder to further assess.  Rx Mobic daily. Tylenol for breakthrough pain.  Avoid heavy lifting and overhead motion of shoulder.  Topical Aspercreme. Heating pad to area.  If x-ray unremarkable and symptoms persist, will need MRI and referral to Orthopedist.

## 2014-06-11 NOTE — Patient Instructions (Signed)
Please go downstairs for x-ray. I will call you with your results.  Take Mobic daily with food.  Take tylenol extra strength for breakthrough pain.  Avoid heaving lifting and overhead motion of right shoulder.  APply topical Aspercreme and heating pad to the area.  If symptoms are not improving over the next 1-2 weeks, we will need to proceed with MRI and referral to a Sports medicine doctor.

## 2014-06-11 NOTE — Progress Notes (Signed)
Pre visit review using our clinic review tool, if applicable. No additional management support is needed unless otherwise documented below in the visit note/SLS  

## 2014-06-11 NOTE — Progress Notes (Signed)
Patient presents to clinic today c/o right shoulder pain that has been present intermittently for 1 month after moving furniture at his house.  Endorses pain when lying on affected side. Denies neck pain or injury. Endorses some decreased range of motion.  Denies numbness or tingling. Shoulder has been making a popping sound occasionally with ROM.  Has been taking Aleve with some relief in symptoms.  Past Medical History  Diagnosis Date  . Hypertension   . Personal history of hyperthyroidism     "pt states his hyperthyroidism went away and hasn't had to take meds in 8 yrs."  . Hyperlipidemia 04/29/2011  . Chicken pox   . Mumps     Current Outpatient Prescriptions on File Prior to Visit  Medication Sig Dispense Refill  . aspirin EC 81 MG tablet Take 81 mg by mouth daily.      . Multiple Vitamins-Minerals (MENS MULTIVITAMIN PLUS) TABS Take 1 each by mouth daily.       Marland Kitchen omega-3 fish oil (MAXEPA) 1000 MG CAPS capsule Take 1 capsule by mouth every other day.      . [DISCONTINUED] quinapril (ACCUPRIL) 10 MG tablet Take 1 tablet (10 mg total) by mouth every morning.  30 tablet  5   No current facility-administered medications on file prior to visit.    No Known Allergies  Family History  Problem Relation Age of Onset  . Heart disease Mother     Living  . Hypertension Mother   . Cancer Father 25    lung-Deceased  . Dementia Maternal Grandfather   . Heart disease Maternal Aunt   . Hypertension Maternal Aunt   . Other Brother     Hyperglycemia/Pre-diabetes  . Healthy Sister     x2  . Healthy Brother     x1 [#2]  . Allergies Daughter   . Healthy Daughter     x1 [#2]    History   Social History  . Marital Status: Married    Spouse Name: N/A    Number of Children: 2  . Years of Education: N/A   Occupational History  .  [employer]   Social History Main Topics  . Smoking status: Never Smoker   . Smokeless tobacco: Never Used  . Alcohol Use: 3.6 oz/week    6  Cans of beer per week  . Drug Use: No  . Sexual Activity: Yes    Partners: Female   Other Topics Concern  . None   Social History Narrative   Regular exercise:  Active at work. Walks 2 x weekly.   Caffeine use: 1 cup coffee daily   Building control surveyor at [employer]                Review of Systems - See HPI.  All other ROS are negative.  BP 138/80  Pulse 82  Temp(Src) 98.5 F (36.9 C) (Oral)  Resp 16  Ht 5\' 6"  (1.676 m)  Wt 198 lb 2 oz (89.869 kg)  BMI 31.99 kg/m2  SpO2 100%  Physical Exam  Vitals reviewed. Constitutional: He is oriented to person, place, and time and well-developed, well-nourished, and in no distress.  HENT:  Head: Normocephalic and atraumatic.  Cardiovascular: Normal rate, regular rhythm, normal heart sounds and intact distal pulses.   Pulmonary/Chest: Effort normal and breath sounds normal. No respiratory distress. He has no wheezes. He has no rales. He exhibits no tenderness.  Musculoskeletal:       Right shoulder: He exhibits  decreased range of motion, tenderness and pain. He exhibits no bony tenderness and normal strength.  Neurological: He is alert and oriented to person, place, and time.  Skin: Skin is warm and dry. No rash noted.  Psychiatric: Affect normal.   Assessment/Plan: Tendinopathy of rotator cuff Will obtain x-ray of right shoulder to further assess.  Rx Mobic daily. Tylenol for breakthrough pain.  Avoid heavy lifting and overhead motion of shoulder.  Topical Aspercreme. Heating pad to area.  If x-ray unremarkable and symptoms persist, will need MRI and referral to Orthopedist.

## 2014-07-08 ENCOUNTER — Other Ambulatory Visit: Payer: Self-pay | Admitting: Physician Assistant

## 2014-07-08 DIAGNOSIS — M67911 Unspecified disorder of synovium and tendon, right shoulder: Secondary | ICD-10-CM

## 2014-07-09 NOTE — Telephone Encounter (Signed)
I spoke with the patient and he would like to see Sports Med. He stated that he did not want to MRI at this time, he wanted to see what the sports med provider suggested before proceeding with the MRI. I place the referral and advised someone would call him. He voiced understanding.      KP

## 2014-07-09 NOTE — Addendum Note (Signed)
Addended by: Arnette NorrisPAYNE, Mirissa Lopresti P on: 07/09/2014 09:06 AM   Modules accepted: Orders

## 2014-07-09 NOTE — Telephone Encounter (Signed)
I will approve refill.  Please call patient to assess symptoms.  IF still present, we need to proceed with referral.

## 2014-07-09 NOTE — Telephone Encounter (Signed)
06/11/2014 7:15 AM Office Visit  Patient Instructions     Please go downstairs for x-ray. I will call you with your results. Take Mobic daily with food. Take tylenol extra strength for breakthrough pain. Avoid heaving lifting and overhead motion of right shoulder. APply topical Aspercreme and heating pad to the area. If symptoms are not improving over the next 1-2 weeks, we will need to proceed with MRI and referral to a Sports medicine doctor.   Last refill 09.30.15 #[id number], please advise/SLS

## 2014-07-09 NOTE — Telephone Encounter (Signed)
Spoke with Joel Chen and she stated she would have the patient call back, but he is not improving and she is requesting that he sees someone however she is not sure if he  Wants to MRI so she will have him call.     KP

## 2014-07-11 ENCOUNTER — Ambulatory Visit (INDEPENDENT_AMBULATORY_CARE_PROVIDER_SITE_OTHER): Payer: BC Managed Care – PPO | Admitting: Family Medicine

## 2014-07-11 ENCOUNTER — Encounter: Payer: Self-pay | Admitting: Family Medicine

## 2014-07-11 ENCOUNTER — Encounter (INDEPENDENT_AMBULATORY_CARE_PROVIDER_SITE_OTHER): Payer: Self-pay

## 2014-07-11 VITALS — BP 148/92 | HR 80 | Ht 66.0 in | Wt 195.0 lb

## 2014-07-11 DIAGNOSIS — M67911 Unspecified disorder of synovium and tendon, right shoulder: Secondary | ICD-10-CM

## 2014-07-11 NOTE — Patient Instructions (Signed)
You have rotator cuff tendinopathy. Try to avoid painful activities (overhead activities, lifting with extended arm) as much as possible. Aleve 2 tabs twice a day with food OR ibuprofen 3 tabs three times a day with food for pain and inflammation. Can take tylenol in addition to this. Subacromial injection may be beneficial to help with pain and to decrease inflammation - make an appointment next week to go ahead with this. Consider physical therapy with transition to home exercise program. Do home exercise program with theraband and scapular stabilization exercises daily - these are very important for long term relief even if an injection was given - 3 sets of 10 once a day. If not improving at follow-up we will consider further imaging, physical therapy and/or nitro patches. Follow up with me in 5-6 weeks.

## 2014-07-14 ENCOUNTER — Encounter: Payer: Self-pay | Admitting: Family Medicine

## 2014-07-14 ENCOUNTER — Ambulatory Visit (INDEPENDENT_AMBULATORY_CARE_PROVIDER_SITE_OTHER): Payer: BC Managed Care – PPO | Admitting: Family Medicine

## 2014-07-14 VITALS — Ht 66.0 in | Wt 195.0 lb

## 2014-07-14 DIAGNOSIS — M67911 Unspecified disorder of synovium and tendon, right shoulder: Secondary | ICD-10-CM

## 2014-07-14 MED ORDER — METHYLPREDNISOLONE ACETATE 40 MG/ML IJ SUSP
40.0000 mg | Freq: Once | INTRAMUSCULAR | Status: AC
  Administered 2014-07-14: 40 mg via INTRA_ARTICULAR

## 2014-07-17 ENCOUNTER — Encounter: Payer: Self-pay | Admitting: Family Medicine

## 2014-07-17 NOTE — Assessment & Plan Note (Signed)
Discussed options.  He has to carry a lot of heavy items this weekend and plans to [place] so recommended he come back early next week for cortisone injection (subacromial).  Reviewed home exercise program to start 5-7 days after the injection.  Tylenol, nsaids.  Consider further imaging, PT, nitro patches if not improving.  F/u in 5-6 weeks.

## 2014-07-17 NOTE — Progress Notes (Signed)
PCP and referred by: Piedad ClimesMartin, William Cody, PA-C  Subjective:   HPI: Patient is a 58 y.o. male here for right shoulder pain.  Patient reports he was moving a metal desk at work about 2 months ago and a couple days later felt pain in his right shoulder. Motion has become more limited. Tried aleve and meloxicam. Difficulty sleeping on right side. Radiographs negative. No prior issues with this shoulder. Is right handed.  Past Medical History  Diagnosis Date  . Hypertension   . Personal history of hyperthyroidism     "pt states his hyperthyroidism went away and hasn't had to take meds in 8 yrs."  . Hyperlipidemia 04/29/2011  . Chicken pox   . Mumps     Current Outpatient Prescriptions on File Prior to Visit  Medication Sig Dispense Refill  . aspirin EC 81 MG tablet Take 81 mg by mouth daily.    . meloxicam (MOBIC) 15 MG tablet TAKE 1 TABLET BY MOUTH EVERY DAY 30 tablet 0  . Multiple Vitamins-Minerals (MENS MULTIVITAMIN PLUS) TABS Take 1 each by mouth daily.     . naproxen sodium (ANAPROX) 220 MG tablet Take 220 mg by mouth 2 (two) times daily with a meal.    . omega-3 fish oil (MAXEPA) 1000 MG CAPS capsule Take 1 capsule by mouth every other day.    . [DISCONTINUED] quinapril (ACCUPRIL) 10 MG tablet Take 1 tablet (10 mg total) by mouth every morning. 30 tablet 5   No current facility-administered medications on file prior to visit.    Past Surgical History  Procedure Laterality Date  . Root canal  1998  . Wisdom tooth extraction  1994  . Colonoscopy  09/2009    hemorrhoids    No Known Allergies  History   Social History  . Marital Status: Married    Spouse Name: N/A    Number of Children: 2  . Years of Education: N/A   Occupational History  .  [employer]   Social History Main Topics  . Smoking status: Never Smoker   . Smokeless tobacco: Never Used  . Alcohol Use: 3.6 oz/week    6 Cans of beer per week  . Drug Use: No  . Sexual Activity:    Partners:  Female   Other Topics Concern  . Not on file   Social History Narrative   Regular exercise:  Active at work. Walks 2 x weekly.   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 Heart disease Mother     Living  . Hypertension Mother   . Cancer Father [phone]    lung-Deceased  . Dementia Maternal Grandfather   . Heart disease Maternal Aunt   . Hypertension Maternal Aunt   . Other Brother     Hyperglycemia/Pre-diabetes  . Healthy Sister     x2  . Healthy Brother     x1 [#2]  . Allergies Daughter   . Healthy Daughter     x1 [#2]    BP 148/92 mmHg  Pulse 80  Ht 5\' 6"  (1.676 m)  Wt 195 lb (88.451 kg)  BMI 31.49 kg/m2  Review of Systems: See HPI above.    Objective:  Physical Exam:  Gen: NAD  Right shoulder: No swelling, ecchymoses.  No gross deformity. No TTP AC joint. FROM with  painful arc. Positive Hawkins, Neers. Negative Speeds, Yergasons. Strength 5/5 with empty can and resisted internal/external rotation.  Pain empty can. Negative apprehension. NV intact distally.    Assessment & Plan:  1. Right shoulder pain - 2/2 rotator cuff tendinopathy.  Discussed options.  He has to carry a lot of heavy items this weekend and plans to DJ so recommended he come back early next week for cortisone injection (subacromial).  Reviewed home exercise program to start 5-7 days after the injection.  Tylenol, nsaids.  Consider further imaging, PT, nitro patches if not improving.  F/u in 5-6 weeks.

## 2014-07-18 NOTE — Progress Notes (Signed)
PCP and referred by: Piedad ClimesMartin, William Cody, PA-C  Subjective:   HPI: Patient is a 58 y.o. male here for right shoulder pain.  10/30: Patient reports he was moving a metal desk at work about 2 months ago and a couple days later felt pain in his right shoulder. Motion has become more limited. Tried aleve and meloxicam. Difficulty sleeping on right side. Radiographs negative. No prior issues with this shoulder. Is right handed.  11/2: Patient returns today for right subacromial injection.  Past Medical History  Diagnosis Date  . Hypertension   . Personal history of hyperthyroidism     "pt states his hyperthyroidism went away and hasn't had to take meds in 8 yrs."  . Hyperlipidemia 04/29/2011  . Chicken pox   . Mumps     Current Outpatient Prescriptions on File Prior to Visit  Medication Sig Dispense Refill  . aspirin EC 81 MG tablet Take 81 mg by mouth daily.    . meloxicam (MOBIC) 15 MG tablet TAKE 1 TABLET BY MOUTH EVERY DAY 30 tablet 0  . Multiple Vitamins-Minerals (MENS MULTIVITAMIN PLUS) TABS Take 1 each by mouth daily.     . naproxen sodium (ANAPROX) 220 MG tablet Take 220 mg by mouth 2 (two) times daily with a meal.    . omega-3 fish oil (MAXEPA) 1000 MG CAPS capsule Take 1 capsule by mouth every other day.    . [DISCONTINUED] quinapril (ACCUPRIL) 10 MG tablet Take 1 tablet (10 mg total) by mouth every morning. 30 tablet 5   No current facility-administered medications on file prior to visit.    Past Surgical History  Procedure Laterality Date  . Root canal  1998  . Wisdom tooth extraction  1994  . Colonoscopy  09/2009    hemorrhoids    No Known Allergies  History   Social History  . Marital Status: Married    Spouse Name: N/A    Number of Children: 2  . Years of Education: N/A   Occupational History  .  [employer]   Social History Main Topics  . Smoking status: Never Smoker   . Smokeless tobacco: Never Used  . Alcohol Use: 3.6 oz/week    6  Cans of beer per week  . Drug Use: No  . Sexual Activity:    Partners: Female   Other Topics Concern  . Not on file   Social History Narrative   Regular exercise:  Active at work. Walks 2 x weekly.   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 Heart disease Mother     Living  . Hypertension Mother   . Cancer Father [phone]    lung-Deceased  . Dementia Maternal Grandfather   . Heart disease Maternal Aunt   . Hypertension Maternal Aunt   . Other Brother     Hyperglycemia/Pre-diabetes  . Healthy Sister     x2  . Healthy Brother     x1 [#2]  . Allergies Daughter   . Healthy Daughter     x1 [#2]    Ht 5\' 6"  (1.676 m)  Wt 195 lb (88.451 kg)  BMI 31.49 kg/m2  Review of Systems: See HPI above.    Objective:  Physical Exam:  Gen: NAD Exam not repeated today. Right shoulder: No swelling, ecchymoses.  No gross deformity.  No TTP AC joint. FROM with painful arc. Positive Hawkins, Neers. Negative Speeds, Yergasons. Strength 5/5 with empty can and resisted internal/external rotation.  Pain empty can. Negative apprehension. NV intact distally.    Assessment & Plan:  1. Right shoulder pain - 2/2 rotator cuff tendinopathy.  Subacromial injection given today.  Reviewed home exercise program to start 5-7 days after the injection.  Tylenol, nsaids.  Consider further imaging, PT, nitro patches if not improving.  F/u in 5-6 weeks.  After informed written consent, patient was seated on exam table. Right shoulder was prepped with alcohol swab and utilizing posterior approach, patient's right subacromial space was injected with 3:1 marcaine: depomedrol. Patient tolerated the procedure well without immediate complications.

## 2014-07-18 NOTE — Assessment & Plan Note (Signed)
2/2 rotator cuff tendinopathy.  Subacromial injection given today.  Reviewed home exercise program to start 5-7 days after the injection.  Tylenol, nsaids.  Consider further imaging, PT, nitro patches if not improving.  F/u in 5-6 weeks.  After informed written consent, patient was seated on exam table. Right shoulder was prepped with alcohol swab and utilizing posterior approach, patient's right subacromial space was injected with 3:1 marcaine: depomedrol. Patient tolerated the procedure well without immediate complications.

## 2015-03-05 IMAGING — CR DG SHOULDER 2+V*R*
3 series · 3 of 3 positions shown · non-contrast
Comparison: None.

CLINICAL DATA: Right shoulder pain.

EXAM:
RIGHT SHOULDER - 2+ VIEW

[w shoulder ap internal righ]
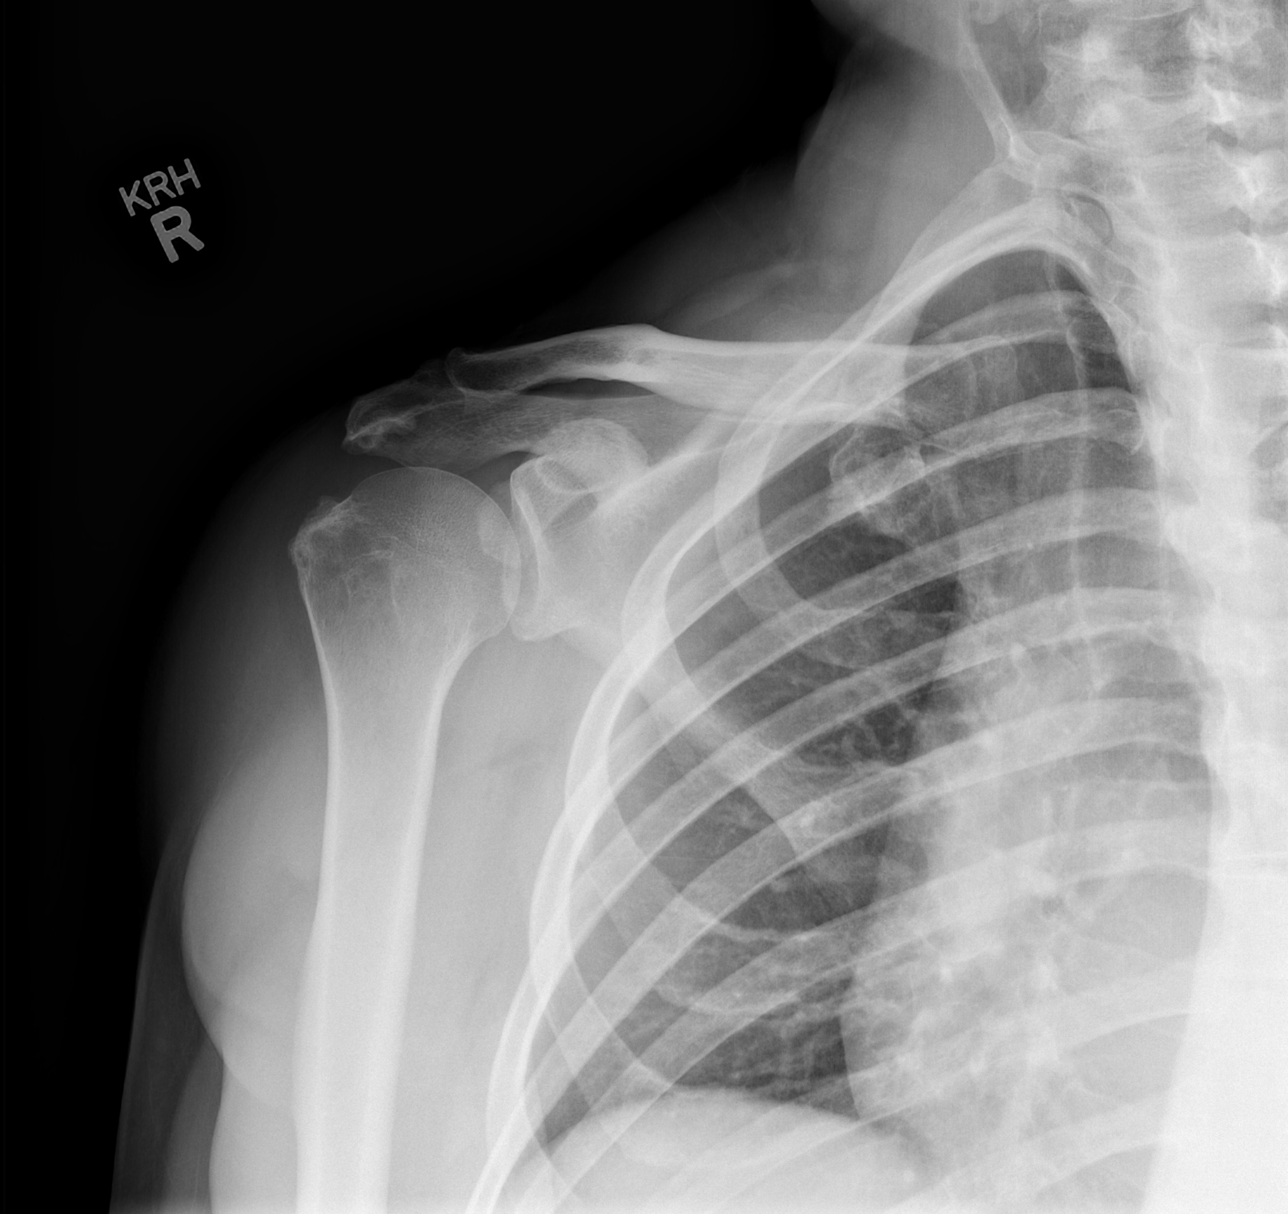

[w shoulder y view right]
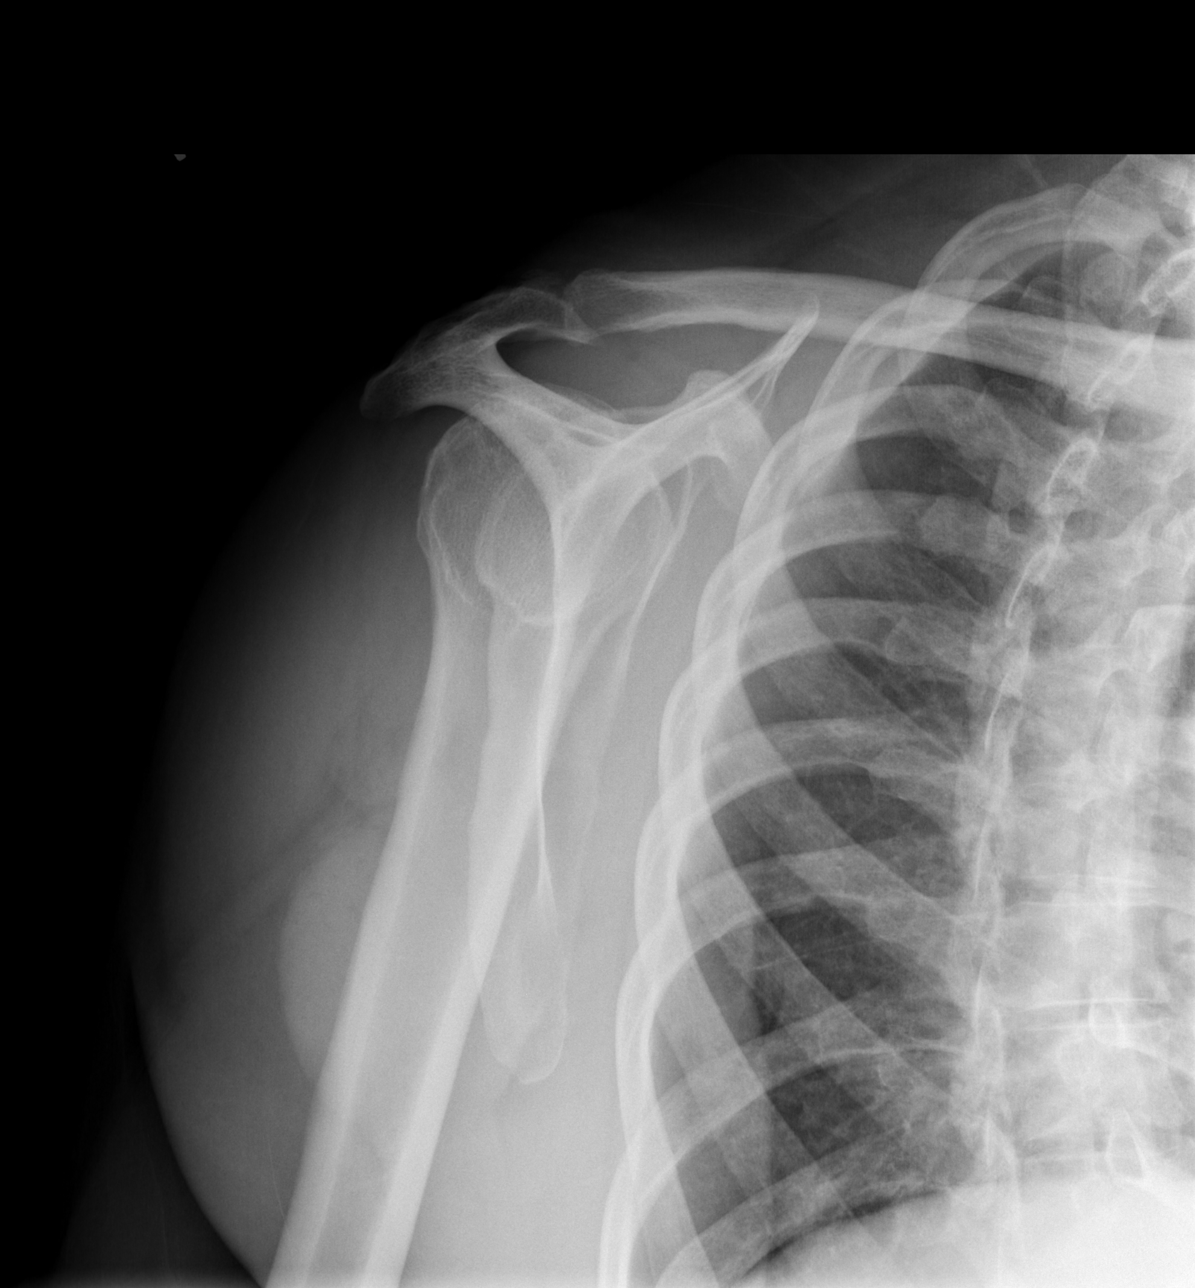

[x shoulder axillary right]
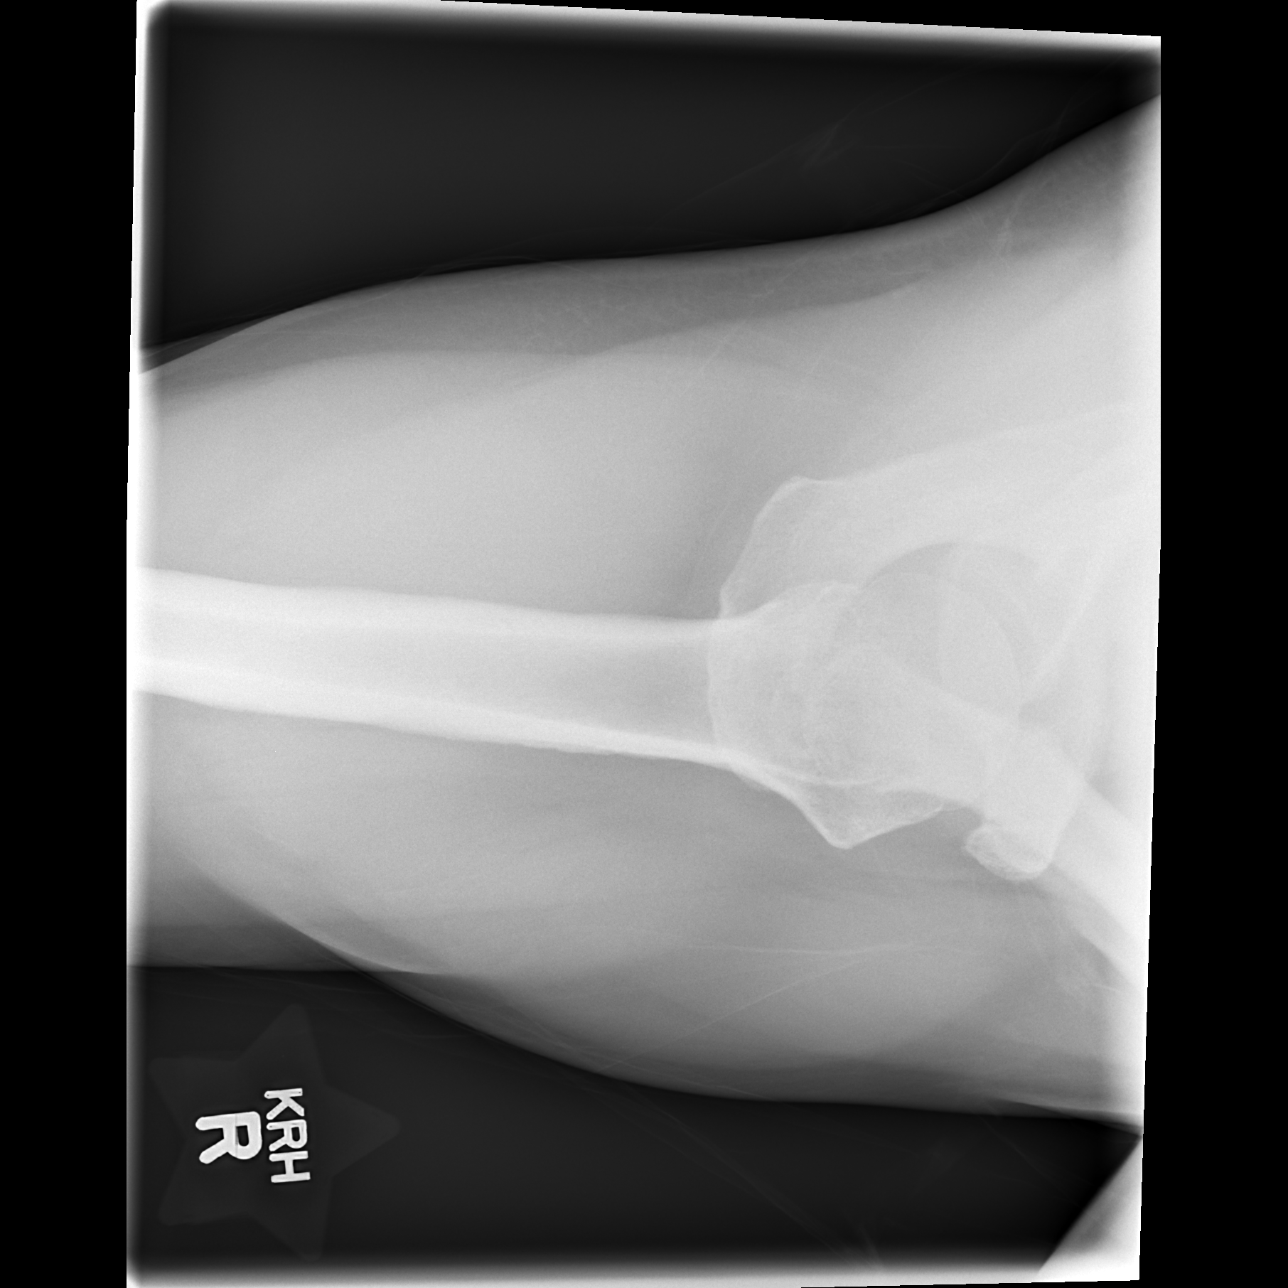

[3 of 3 positions shown; findings below may reference images not displayed]

FINDINGS: There is no evidence of fracture or dislocation. There is no
evidence of arthropathy or other focal bone abnormality. Soft
tissues are unremarkable.
IMPRESSION: Normal right shoulder.

## 2015-07-06 ENCOUNTER — Encounter: Payer: Self-pay | Admitting: Physician Assistant

## 2015-07-06 ENCOUNTER — Ambulatory Visit (INDEPENDENT_AMBULATORY_CARE_PROVIDER_SITE_OTHER): Payer: 59 | Admitting: Physician Assistant

## 2015-07-06 ENCOUNTER — Encounter (INDEPENDENT_AMBULATORY_CARE_PROVIDER_SITE_OTHER): Payer: Self-pay

## 2015-07-06 VITALS — BP 132/88 | HR 78 | Temp 98.1°F | Resp 16 | Ht 66.0 in | Wt 207.0 lb

## 2015-07-06 DIAGNOSIS — R0683 Snoring: Secondary | ICD-10-CM

## 2015-07-06 DIAGNOSIS — R0681 Apnea, not elsewhere classified: Secondary | ICD-10-CM | POA: Diagnosis not present

## 2015-07-06 DIAGNOSIS — E785 Hyperlipidemia, unspecified: Secondary | ICD-10-CM | POA: Diagnosis not present

## 2015-07-06 DIAGNOSIS — Z23 Encounter for immunization: Secondary | ICD-10-CM

## 2015-07-06 DIAGNOSIS — I10 Essential (primary) hypertension: Secondary | ICD-10-CM

## 2015-07-06 DIAGNOSIS — Z0000 Encounter for general adult medical examination without abnormal findings: Secondary | ICD-10-CM

## 2015-07-06 DIAGNOSIS — G4733 Obstructive sleep apnea (adult) (pediatric): Secondary | ICD-10-CM | POA: Insufficient documentation

## 2015-07-06 NOTE — Assessment & Plan Note (Signed)
Stable today. Repeat BP acceptable. Continue TLC. Dash diet again encouraged.

## 2015-07-06 NOTE — Assessment & Plan Note (Signed)
Home sleep study ordered to assess further.

## 2015-07-06 NOTE — Assessment & Plan Note (Signed)
Depression screen negative. Health Maintenance reviewed -- Flu shot updated. Colonoscopy and TDaP up-to-date. Preventive schedule discussed and handout given in AVS. Will obtain fasting labs today.

## 2015-07-06 NOTE — Assessment & Plan Note (Signed)
Will order home sleep test through [facility] to assess further and r/o OSA.

## 2015-07-06 NOTE — Assessment & Plan Note (Signed)
Will obtain fasting lipid panel today. 

## 2015-07-06 NOTE — Progress Notes (Signed)
Pre visit review using our clinic review tool, if applicable. No additional management support is needed unless otherwise documented below in the visit note/SLS  

## 2015-07-06 NOTE — Progress Notes (Signed)
Patient presents to clinic today to establish care.  Acute Concerns: No concerns at today's visit. Wife has noticed patient snoring at night. Wife also endorses apneic episodes occasionally.  Chronic Issues: Hypertension -- Previously controlled with diet and exercise. Patient denies chest pain, palpitations, lightheadedness, dizziness, vision changes or frequent headaches.  BP Readings from Last 3 Encounters:  07/06/15 143/72  07/11/14 148/92  06/11/14 138/80   Health Maintenance: Dental -- up-to-date Vision -- up-to-date Immunizations -- Flu shot given today.  Colonoscopy -- up-to-date  Past Medical History  Diagnosis Date  . Hypertension   . Personal history of hyperthyroidism     "pt states his hyperthyroidism went away and hasn't had to take meds in 8 yrs."  . Hyperlipidemia 04/29/2011  . Chicken pox   . Mumps     Past Surgical History  Procedure Laterality Date  . Root canal  1998  . Wisdom tooth extraction  1994  . Colonoscopy  09/2009    hemorrhoids    Current Outpatient Prescriptions on File Prior to Visit  Medication Sig Dispense Refill  . aspirin EC 81 MG tablet Take 81 mg by mouth daily.    . meloxicam (MOBIC) 15 MG tablet TAKE 1 TABLET BY MOUTH EVERY DAY (Patient taking differently: TAKE 1 TABLET BY MOUTH EVERY DAY AS NEEDED) 30 tablet 0  . Multiple Vitamins-Minerals (MENS MULTIVITAMIN PLUS) TABS Take 1 each by mouth daily.     . naproxen sodium (ANAPROX) 220 MG tablet Take 220 mg by mouth 2 (two) times daily as needed.     Marland Kitchen omega-3 fish oil (MAXEPA) 1000 MG CAPS capsule Take 1 capsule by mouth every other day. As Directed    . [DISCONTINUED] quinapril (ACCUPRIL) 10 MG tablet Take 1 tablet (10 mg total) by mouth every morning. 30 tablet 5   No current facility-administered medications on file prior to visit.    No Known Allergies  Family History  Problem Relation Age of Onset  . Heart disease Mother     Living  . Hypertension Mother   . Cancer  Father 24    lung-Deceased  . Dementia Maternal Grandfather   . Heart disease Maternal Aunt   . Hypertension Maternal Aunt   . Other Brother     Hyperglycemia/Pre-diabetes  . Healthy Sister     x2  . Healthy Brother     x1 [#2]  . Allergies Daughter   . Healthy Daughter     x1 [#2]    Social History   Social History  . Marital Status: Married    Spouse Name: N/A  . Number of Children: 2  . Years of Education: N/A   Occupational History  . DJ     self-employed   Social History Main Topics  . Smoking status: Never Smoker   . Smokeless tobacco: Never Used  . Alcohol Use: 3.6 oz/week    6 Cans of beer per week  . Drug Use: No  . Sexual Activity:    Partners: Female   Other Topics Concern  . Not on file   Social History Narrative   Regular exercise:  Active at work. Walks 2 x weekly.   Caffeine use: 1 cup coffee daily   Building control surveyor at [employer]                Review of Systems  Constitutional: Negative for fever and weight loss.  HENT: Negative for ear discharge, ear pain, hearing loss and tinnitus.  Eyes: Negative for blurred vision, double vision, photophobia and pain.  Respiratory: Negative for cough and shortness of breath.   Cardiovascular: Negative for chest pain and palpitations.  Gastrointestinal: Negative for heartburn, nausea, vomiting, abdominal pain, diarrhea, constipation, blood in stool and melena.  Genitourinary: Negative for dysuria, urgency, frequency, hematuria and flank pain.  Musculoskeletal: Negative for falls.  Neurological: Negative for dizziness, loss of consciousness and headaches.  Endo/Heme/Allergies: Negative for environmental allergies.  Psychiatric/Behavioral: Negative for depression, suicidal ideas, hallucinations and substance abuse. The patient is not nervous/anxious and does not have insomnia.     BP 143/72 mmHg  Pulse 78  Temp(Src) 98.1 F (36.7 C) (Oral)  Resp 16  Ht 5\' 6"  (1.676 m)  Wt 207 lb (93.895 kg)   BMI 33.43 kg/m2  SpO2 100%  Physical Exam  Constitutional: He is oriented to person, place, and time and well-developed, well-nourished, and in no distress.  HENT:  Head: Normocephalic and atraumatic.  Right Ear: External ear normal.  Left Ear: External ear normal.  Nose: Nose normal.  Mouth/Throat: Oropharynx is clear and moist. No oropharyngeal exudate.  Eyes: Conjunctivae and EOM are normal. Pupils are equal, round, and reactive to light.  Neck: Neck supple. No thyromegaly present.  Cardiovascular: Normal rate, regular rhythm, normal heart sounds and intact distal pulses.   Pulmonary/Chest: Effort normal and breath sounds normal. No respiratory distress. He has no wheezes. He has no rales. He exhibits no tenderness.  Abdominal: Soft. Bowel sounds are normal. He exhibits no distension and no mass. There is no tenderness. There is no rebound and no guarding.  Genitourinary: Testes/scrotum normal and penis normal. No discharge found.  Lymphadenopathy:    He has no cervical adenopathy.  Neurological: He is alert and oriented to person, place, and time.  Skin: Skin is warm and dry. No rash noted.  Psychiatric: Affect normal.  Vitals reviewed.  No results found for this or any previous visit (from the past 2160 hour(s)).  Assessment/Plan: Visit for preventive health examination Depression screen negative. Health Maintenance reviewed -- Flu shot updated. Colonoscopy and TDaP up-to-date. Preventive schedule discussed and handout given in AVS. Will obtain fasting labs today.   Snoring Will order home sleep test through [facility] to assess further and r/o OSA.  Hyperlipidemia Will obtain fasting lipid panel today.  HTN (hypertension) Stable today. Repeat BP acceptable. Continue TLC. Dash diet again encouraged.  Apneic episode Home sleep study ordered to assess further.

## 2015-07-06 NOTE — Patient Instructions (Signed)
Please go to the lab for blood work.  I will call you with your results. If your blood work is normal we will follow-up yearly for physicals.  If anything is abnormal we will treat accordingly.  I will try to set up a home sleep study for you.  Preventive Care for Adults, Male A healthy lifestyle and preventive care can promote health and wellness. Preventive health guidelines for men include the following key practices:  A routine yearly physical is a good way to check with your health care provider about your health and preventative screening. It is a chance to share any concerns and updates on your health and to receive a thorough exam.  Visit your dentist for a routine exam and preventative care every 6 months. Brush your teeth twice a day and floss once a day. Good oral hygiene prevents tooth decay and gum disease.  The frequency of eye exams is based on your age, health, family medical history, use of contact lenses, and other factors. Follow your health care provider's recommendations for frequency of eye exams.  Eat a healthy diet. Foods such as vegetables, fruits, whole grains, low-fat dairy products, and lean protein foods contain the nutrients you need without too many calories. Decrease your intake of foods high in solid fats, added sugars, and salt. Eat the right amount of calories for you.Get information about a proper diet from your health care provider, if necessary.  Regular physical exercise is one of the most important things you can do for your health. Most adults should get at least 150 minutes of moderate-intensity exercise (any activity that increases your heart rate and causes you to sweat) each week. In addition, most adults need muscle-strengthening exercises on 2 or more days a week.  Maintain a healthy weight. The body mass index (BMI) is a screening tool to identify possible weight problems. It provides an estimate of body fat based on height and weight. Your health  care provider can find your BMI and can help you achieve or maintain a healthy weight.For adults 20 years and older:  A BMI below 18.5 is considered underweight.  A BMI of 18.5 to 24.9 is normal.  A BMI of 25 to 29.9 is considered overweight.  A BMI of 30 and above is considered obese.  Maintain normal blood lipids and cholesterol levels by exercising and minimizing your intake of saturated fat. Eat a balanced diet with plenty of fruit and vegetables. Blood tests for lipids and cholesterol should begin at age 64 and be repeated every 5 years. If your lipid or cholesterol levels are high, you are over 50, or you are at high risk for heart disease, you may need your cholesterol levels checked more frequently.Ongoing high lipid and cholesterol levels should be treated with medicines if diet and exercise are not working.  If you smoke, find out from your health care provider how to quit. If you do not use tobacco, do not start.  Lung cancer screening is recommended for adults aged 32-80 years who are at high risk for developing lung cancer because of a history of smoking. A yearly low-dose CT scan of the lungs is recommended for people who have at least a 30-pack-year history of smoking and are a current smoker or have quit within the past 15 years. A pack year of smoking is smoking an average of 1 pack of cigarettes a day for 1 year (for example: 1 pack a day for 30 years or 2 packs a  day for 15 years). Yearly screening should continue until the smoker has stopped smoking for at least 15 years. Yearly screening should be stopped for people who develop a health problem that would prevent them from having lung cancer treatment.  If you choose to drink alcohol, do not have more than 2 drinks per day. One drink is considered to be 12 ounces (355 mL) of beer, 5 ounces (148 mL) of wine, or 1.5 ounces (44 mL) of liquor.  Avoid use of street drugs. Do not share needles with anyone. Ask for help if you need  support or instructions about stopping the use of drugs.  High blood pressure causes heart disease and increases the risk of stroke. Your blood pressure should be checked at least every 1-2 years. Ongoing high blood pressure should be treated with medicines, if weight loss and exercise are not effective.  If you are 30-52 years old, ask your health care provider if you should take aspirin to prevent heart disease.  Diabetes screening is done by taking a blood sample to check your blood glucose level after you have not eaten for a certain period of time (fasting). If you are not overweight and you do not have risk factors for diabetes, you should be screened once every 3 years starting at age 51. If you are overweight or obese and you are 79-14 years of age, you should be screened for diabetes every year as part of your cardiovascular risk assessment.  Colorectal cancer can be detected and often prevented. Most routine colorectal cancer screening begins at the age of 68 and continues through age 23. However, your health care provider may recommend screening at an earlier age if you have risk factors for colon cancer. On a yearly basis, your health care provider may provide home test kits to check for hidden blood in the stool. Use of a small camera at the end of a tube to directly examine the colon (sigmoidoscopy or colonoscopy) can detect the earliest forms of colorectal cancer. Talk to your health care provider about this at age 3, when routine screening begins. Direct exam of the colon should be repeated every 5-10 years through age 64, unless early forms of precancerous polyps or small growths are found.  People who are at an increased risk for hepatitis B should be screened for this virus. You are considered at high risk for hepatitis B if:  You were born in a country where hepatitis B occurs often. Talk with your health care provider about which countries are considered high risk.  Your parents  were born in a high-risk country and you have not received a shot to protect against hepatitis B (hepatitis B vaccine).  You have HIV or AIDS.  You use needles to inject street drugs.  You live with, or have sex with, someone who has hepatitis B.  You are a man who has sex with other men (MSM).  You get hemodialysis treatment.  You take certain medicines for conditions such as cancer, organ transplantation, and autoimmune conditions.  Hepatitis C blood testing is recommended for all people born from [birth year] through 1965 and any individual with known risks for hepatitis C.  Practice safe sex. Use condoms and avoid high-risk sexual practices to reduce the spread of sexually transmitted infections (STIs). STIs include gonorrhea, chlamydia, syphilis, trichomonas, herpes, HPV, and human immunodeficiency virus (HIV). Herpes, HIV, and HPV are viral illnesses that have no cure. They can result in disability, cancer, and death.  If  you are a man who has sex with other men, you should be screened at least once per year for:  HIV.  Urethral, rectal, and pharyngeal infection of gonorrhea, chlamydia, or both.  If you are at risk of being infected with HIV, it is recommended that you take a prescription medicine daily to prevent HIV infection. This is called preexposure prophylaxis (PrEP). You are considered at risk if:  You are a man who has sex with other men (MSM) and have other risk factors.  You are a heterosexual man, are sexually active, and are at increased risk for HIV infection.  You take drugs by injection.  You are sexually active with a partner who has HIV.  Talk with your health care provider about whether you are at high risk of being infected with HIV. If you choose to begin PrEP, you should first be tested for HIV. You should then be tested every 3 months for as long as you are taking PrEP.  A one-time screening for abdominal aortic aneurysm (AAA) and surgical repair of large AAAs  by ultrasound are recommended for men ages 38 to 7 years who are current or former smokers.  Healthy men should no longer receive prostate-specific antigen (PSA) blood tests as part of routine cancer screening. Talk with your health care provider about prostate cancer screening.  Testicular cancer screening is not recommended for adult males who have no symptoms. Screening includes self-exam, a health care provider exam, and other screening tests. Consult with your health care provider about any symptoms you have or any concerns you have about testicular cancer.  Use sunscreen. Apply sunscreen liberally and repeatedly throughout the day. You should seek shade when your shadow is shorter than you. Protect yourself by wearing long sleeves, pants, a wide-brimmed hat, and sunglasses year round, whenever you are outdoors.  Once a month, do a whole-body skin exam, using a mirror to look at the skin on your back. Tell your health care provider about new moles, moles that have irregular borders, moles that are larger than a pencil eraser, or moles that have changed in shape or color.  Stay current with required vaccines (immunizations).  Influenza vaccine. All adults should be immunized every year.  Tetanus, diphtheria, and acellular pertussis (Td, Tdap) vaccine. An adult who has not previously received Tdap or who does not know his vaccine status should receive 1 dose of Tdap. This initial dose should be followed by tetanus and diphtheria toxoids (Td) booster doses every 10 years. Adults with an unknown or incomplete history of completing a 3-dose immunization series with Td-containing vaccines should begin or complete a primary immunization series including a Tdap dose. Adults should receive a Td booster every 10 years.  Varicella vaccine. An adult without evidence of immunity to varicella should receive 2 doses or a second dose if he has previously received 1 dose.  Human papillomavirus (HPV) vaccine.  Males aged 11-21 years who have not received the vaccine previously should receive the 3-dose series. Males aged 22-26 years may be immunized. Immunization is recommended through the age of 60 years for any male who has sex with males and did not get any or all doses earlier. Immunization is recommended for any person with an immunocompromised condition through the age of 45 years if he did not get any or all doses earlier. During the 3-dose series, the second dose should be obtained 4-8 weeks after the first dose. The third dose should be obtained 24 weeks after the  first dose and 16 weeks after the second dose.  Zoster vaccine. One dose is recommended for adults aged 48 years or older unless certain conditions are present.  Measles, mumps, and rubella (MMR) vaccine. Adults born before [birth year] generally are considered immune to measles and mumps. Adults born in [birth year] or later should have 1 or more doses of MMR vaccine unless there is a contraindication to the vaccine or there is laboratory evidence of immunity to each of the three diseases. A routine second dose of MMR vaccine should be obtained at least 28 days after the first dose for students attending postsecondary schools, health care workers, or international travelers. People who received inactivated measles vaccine or an unknown type of measles vaccine during 1963-1967 should receive 2 doses of MMR vaccine. People who received inactivated mumps vaccine or an unknown type of mumps vaccine before 1979 and are at high risk for mumps infection should consider immunization with 2 doses of MMR vaccine. Unvaccinated health care workers born before [birth year] who lack laboratory evidence of measles, mumps, or rubella immunity or laboratory confirmation of disease should consider measles and mumps immunization with 2 doses of MMR vaccine or rubella immunization with 1 dose of MMR vaccine.  Pneumococcal 13-valent conjugate (PCV13) vaccine. When indicated, a person who is  uncertain of his immunization history and has no record of immunization should receive the PCV13 vaccine. All adults 36 years of age and older should receive this vaccine. An adult aged 5 years or older who has certain medical conditions and has not been previously immunized should receive 1 dose of PCV13 vaccine. This PCV13 should be followed with a dose of pneumococcal polysaccharide (PPSV23) vaccine. Adults who are at high risk for pneumococcal disease should obtain the PPSV23 vaccine at least 8 weeks after the dose of PCV13 vaccine. Adults older than 57 years of age who have normal immune system function should obtain the PPSV23 vaccine dose at least 1 year after the dose of PCV13 vaccine.  Pneumococcal polysaccharide (PPSV23) vaccine. When PCV13 is also indicated, PCV13 should be obtained first. All adults aged 15 years and older should be immunized. An adult younger than age 40 years who has certain medical conditions should be immunized. Any person who resides in a nursing home or long-term care facility should be immunized. An adult smoker should be immunized. People with an immunocompromised condition and certain other conditions should receive both PCV13 and PPSV23 vaccines. People with human immunodeficiency virus (HIV) infection should be immunized as soon as possible after diagnosis. Immunization during chemotherapy or radiation therapy should be avoided. Routine use of PPSV23 vaccine is not recommended for American Indians, [place] Natives, or people younger than 65 years unless there are medical conditions that require PPSV23 vaccine. When indicated, people who have unknown immunization and have no record of immunization should receive PPSV23 vaccine. One-time revaccination 5 years after the first dose of PPSV23 is recommended for people aged 19-64 years who have chronic kidney failure, nephrotic syndrome, asplenia, or immunocompromised conditions. People who received 1-2 doses of PPSV23 before age  32 years should receive another dose of PPSV23 vaccine at age 41 years or later if at least 5 years have passed since the previous dose. Doses of PPSV23 are not needed for people immunized with PPSV23 at or after age 22 years.  Meningococcal vaccine. Adults with asplenia or persistent complement component deficiencies should receive 2 doses of quadrivalent meningococcal conjugate (MenACWY-D) vaccine. The doses should be obtained at least 2 months apart.  Microbiologists working with certain meningococcal bacteria, [employer] recruits, people at risk during an outbreak, and people who travel to or live in countries with a high rate of meningitis should be immunized. A first-year college student up through age 80 years who is living in a residence hall should receive a dose if he did not receive a dose on or after his 16th birthday. Adults who have certain high-risk conditions should receive one or more doses of vaccine.  Hepatitis A vaccine. Adults who wish to be protected from this disease, have chronic liver disease, work with hepatitis A-infected animals, work in hepatitis A research labs, or travel to or work in countries with a high rate of hepatitis A should be immunized. Adults who were previously unvaccinated and who anticipate close contact with an international adoptee during the first 60 days after arrival in the [place] from a country with a high rate of hepatitis A should be immunized.  Hepatitis B vaccine. Adults should be immunized if they wish to be protected from this disease, are under age 28 years and have diabetes, have chronic liver disease, have had more than one sex partner in the past 6 months, may be exposed to blood or other infectious body fluids, are household contacts or sex partners of hepatitis B positive people, are clients or workers in certain care facilities, or travel to or work in countries with a high rate of hepatitis B.  Haemophilus influenzae type b (Hib) vaccine. A  previously unvaccinated person with asplenia or sickle cell disease or having a scheduled splenectomy should receive 1 dose of Hib vaccine. Regardless of previous immunization, a recipient of a hematopoietic stem cell transplant should receive a 3-dose series 6-12 months after his successful transplant. Hib vaccine is not recommended for adults with HIV infection. Preventive Service / Frequency Ages 110 to 17  Blood pressure check.** / Every 3-5 years.  Lipid and cholesterol check.** / Every 5 years beginning at age 17.  Hepatitis C blood test.** / For any individual with known risks for hepatitis C.  Skin self-exam. / Monthly.  Influenza vaccine. / Every year.  Tetanus, diphtheria, and acellular pertussis (Tdap, Td) vaccine.** / Consult your health care provider. 1 dose of Td every 10 years.  Varicella vaccine.** / Consult your health care provider.  HPV vaccine. / 3 doses over 6 months, if 36 or younger.  Measles, mumps, rubella (MMR) vaccine.** / You need at least 1 dose of MMR if you were born in 1957 or later. You may also need a second dose.  Pneumococcal 13-valent conjugate (PCV13) vaccine.** / Consult your health care provider.  Pneumococcal polysaccharide (PPSV23) vaccine.** / 1 to 2 doses if you smoke cigarettes or if you have certain conditions.  Meningococcal vaccine.** / 1 dose if you are age 48 to 65 years and a Market researcher living in a residence hall, or have one of several medical conditions. You may also need additional booster doses.  Hepatitis A vaccine.** / Consult your health care provider.  Hepatitis B vaccine.** / Consult your health care provider.  Haemophilus influenzae type b (Hib) vaccine.** / Consult your health care provider. Ages 44 to 50  Blood pressure check.** / Every year.  Lipid and cholesterol check.** / Every 5 years beginning at age 29.  Lung cancer screening. / Every year if you are aged 37-80 years and have a 30-pack-year  history of smoking and currently smoke or have quit within the past 15 years. Yearly screening is  stopped once you have quit smoking for at least 15 years or develop a health problem that would prevent you from having lung cancer treatment.  Fecal occult blood test (FOBT) of stool. / Every year beginning at age 14 and continuing until age 72. You may not have to do this test if you get a colonoscopy every 10 years.  Flexible sigmoidoscopy** or colonoscopy.** / Every 5 years for a flexible sigmoidoscopy or every 10 years for a colonoscopy beginning at age 46 and continuing until age 63.  Hepatitis C blood test.** / For all people born from [birth year] through 1963 and any individual with known risks for hepatitis C.  Skin self-exam. / Monthly.  Influenza vaccine. / Every year.  Tetanus, diphtheria, and acellular pertussis (Tdap/Td) vaccine.** / Consult your health care provider. 1 dose of Td every 10 years.  Varicella vaccine.** / Consult your health care provider.  Zoster vaccine.** / 1 dose for adults aged 82 years or older.  Measles, mumps, rubella (MMR) vaccine.** / You need at least 1 dose of MMR if you were born in 1957 or later. You may also need a second dose.  Pneumococcal 13-valent conjugate (PCV13) vaccine.** / Consult your health care provider.  Pneumococcal polysaccharide (PPSV23) vaccine.** / 1 to 2 doses if you smoke cigarettes or if you have certain conditions.  Meningococcal vaccine.** / Consult your health care provider.  Hepatitis A vaccine.** / Consult your health care provider.  Hepatitis B vaccine.** / Consult your health care provider.  Haemophilus influenzae type b (Hib) vaccine.** / Consult your health care provider. Ages 36 and over  Blood pressure check.** / Every year.  Lipid and cholesterol check.**/ Every 5 years beginning at age 32.  Lung cancer screening. / Every year if you are aged 6-80 years and have a 30-pack-year history of smoking and currently  smoke or have quit within the past 15 years. Yearly screening is stopped once you have quit smoking for at least 15 years or develop a health problem that would prevent you from having lung cancer treatment.  Fecal occult blood test (FOBT) of stool. / Every year beginning at age 59 and continuing until age 65. You may not have to do this test if you get a colonoscopy every 10 years.  Flexible sigmoidoscopy** or colonoscopy.** / Every 5 years for a flexible sigmoidoscopy or every 10 years for a colonoscopy beginning at age 55 and continuing until age 25.  Hepatitis C blood test.** / For all people born from [birth year] through 1963 and any individual with known risks for hepatitis C.  Abdominal aortic aneurysm (AAA) screening.** / A one-time screening for ages 5 to 105 years who are current or former smokers.  Skin self-exam. / Monthly.  Influenza vaccine. / Every year.  Tetanus, diphtheria, and acellular pertussis (Tdap/Td) vaccine.** / 1 dose of Td every 10 years.  Varicella vaccine.** / Consult your health care provider.  Zoster vaccine.** / 1 dose for adults aged 75 years or older.  Pneumococcal 13-valent conjugate (PCV13) vaccine.** / 1 dose for all adults aged 76 years and older.  Pneumococcal polysaccharide (PPSV23) vaccine.** / 1 dose for all adults aged 50 years and older.  Meningococcal vaccine.** / Consult your health care provider.  Hepatitis A vaccine.** / Consult your health care provider.  Hepatitis B vaccine.** / Consult your health care provider.  Haemophilus influenzae type b (Hib) vaccine.** / Consult your health care provider. **Family history and personal history of risk and conditions may change your  health care provider's recommendations.   This information is not intended to replace advice given to you by your health care provider. Make sure you discuss any questions you have with your health care provider.   Document Released: 10/25/2001 Document Revised:  09/19/2014 Document Reviewed: 01/24/2011 Elsevier Interactive Patient Education [employer].

## 2015-07-07 LAB — CBC
HCT: 46 % (ref 39.0–52.0)
HEMOGLOBIN: 14.9 g/dL (ref 13.0–17.0)
MCHC: 32.3 g/dL (ref 30.0–36.0)
MCV: 84.7 fl (ref 78.0–100.0)
PLATELETS: 250 10*3/uL (ref 150.0–400.0)
RBC: 5.43 Mil/uL (ref 4.22–5.81)
RDW: 14.6 % (ref 11.5–15.5)
WBC: 4.5 10*3/uL (ref 4.0–10.5)

## 2015-07-07 LAB — COMPREHENSIVE METABOLIC PANEL
ALK PHOS: 66 U/L (ref 39–117)
ALT: 26 U/L (ref 0–53)
AST: 18 U/L (ref 0–37)
Albumin: 4.2 g/dL (ref 3.5–5.2)
BILIRUBIN TOTAL: 0.9 mg/dL (ref 0.2–1.2)
BUN: 13 mg/dL (ref 6–23)
CO2: 32 meq/L (ref 19–32)
CREATININE: 1.06 mg/dL (ref 0.40–1.50)
Calcium: 9.7 mg/dL (ref 8.4–10.5)
Chloride: 104 mEq/L (ref 96–112)
GFR: 92.6 mL/min (ref >60.00)
GLUCOSE: 81 mg/dL (ref 70–99)
Potassium: 4.4 mEq/L (ref 3.5–5.1)
Sodium: 143 mEq/L (ref 135–145)
TOTAL PROTEIN: 6.6 g/dL (ref 6.0–8.3)

## 2015-07-07 LAB — URINALYSIS, ROUTINE W REFLEX MICROSCOPIC
Bilirubin Urine: NEGATIVE
Hgb urine dipstick: NEGATIVE
KETONES UR: NEGATIVE
Leukocytes, UA: NEGATIVE
Nitrite: NEGATIVE
PH: 6.5 (ref 5.0–8.0)
RBC / HPF: NONE SEEN (ref 0–?)
SPECIFIC GRAVITY, URINE: 1.02 (ref 1.000–1.030)
Total Protein, Urine: NEGATIVE
Urine Glucose: NEGATIVE
Urobilinogen, UA: 0.2 (ref 0.0–1.0)
WBC UA: NONE SEEN (ref 0–?)

## 2015-07-07 LAB — LIPID PANEL
CHOL/HDL RATIO: 5
CHOLESTEROL: 214 mg/dL — AB (ref 0–200)
HDL: 42.9 mg/dL (ref >39.00)
NONHDL: 170.85
TRIGLYCERIDES: 232 mg/dL — AB (ref 0.0–149.0)
VLDL: 46.4 mg/dL — AB (ref 0.0–40.0)

## 2015-07-07 LAB — TSH: TSH: 1.25 u[IU]/mL (ref 0.35–4.50)

## 2015-07-07 LAB — LDL CHOLESTEROL, DIRECT: Direct LDL: 143 mg/dL

## 2015-07-07 LAB — PSA: PSA: 1.87 ng/mL (ref 0.10–4.00)

## 2015-07-09 ENCOUNTER — Telehealth: Payer: Self-pay

## 2015-07-09 NOTE — Telephone Encounter (Signed)
Advised pt to take the fish oil daily and to come back in three months for some repeat blood work. .Marland Kitchen

## 2015-07-09 NOTE — Telephone Encounter (Signed)
Pt states he has not been taking the fish oil but one to two times a week. Lorain ChildesFYI Derwood Kaplan//HM

## 2015-07-09 NOTE — Telephone Encounter (Signed)
Recommend he take every day. Will recheck TGL levels in 3 month.

## 2016-07-08 ENCOUNTER — Encounter: Payer: Self-pay | Admitting: Physician Assistant

## 2016-07-08 ENCOUNTER — Ambulatory Visit (INDEPENDENT_AMBULATORY_CARE_PROVIDER_SITE_OTHER): Payer: BLUE CROSS/BLUE SHIELD | Admitting: Physician Assistant

## 2016-07-08 VITALS — BP 136/78 | HR 88 | Temp 98.4°F | Resp 16 | Ht 66.0 in | Wt 201.4 lb

## 2016-07-08 DIAGNOSIS — E785 Hyperlipidemia, unspecified: Secondary | ICD-10-CM | POA: Diagnosis not present

## 2016-07-08 DIAGNOSIS — Z0000 Encounter for general adult medical examination without abnormal findings: Secondary | ICD-10-CM

## 2016-07-08 DIAGNOSIS — Z125 Encounter for screening for malignant neoplasm of prostate: Secondary | ICD-10-CM | POA: Diagnosis not present

## 2016-07-08 DIAGNOSIS — N401 Enlarged prostate with lower urinary tract symptoms: Secondary | ICD-10-CM | POA: Diagnosis not present

## 2016-07-08 LAB — URINALYSIS, ROUTINE W REFLEX MICROSCOPIC
Bilirubin Urine: NEGATIVE
Hgb urine dipstick: NEGATIVE
LEUKOCYTES UA: NEGATIVE
Nitrite: NEGATIVE
PH: 6 (ref 5.0–8.0)
RBC / HPF: NONE SEEN (ref 0–?)
SPECIFIC GRAVITY, URINE: 1.015 (ref 1.000–1.030)
Total Protein, Urine: NEGATIVE
Urine Glucose: NEGATIVE
Urobilinogen, UA: 0.2 (ref 0.0–1.0)
WBC, UA: NONE SEEN (ref 0–?)

## 2016-07-08 LAB — CBC
HEMATOCRIT: 46.8 % (ref 39.0–52.0)
Hemoglobin: 15.4 g/dL (ref 13.0–17.0)
MCHC: 32.9 g/dL (ref 30.0–36.0)
MCV: 83.8 fl (ref 78.0–100.0)
Platelets: 249 10*3/uL (ref 150.0–400.0)
RBC: 5.59 Mil/uL (ref 4.22–5.81)
RDW: 14.6 % (ref 11.5–15.5)
WBC: 5.3 10*3/uL (ref 4.0–10.5)

## 2016-07-08 LAB — COMPREHENSIVE METABOLIC PANEL
ALBUMIN: 4.4 g/dL (ref 3.5–5.2)
ALT: 24 U/L (ref 0–53)
AST: 17 U/L (ref 0–37)
Alkaline Phosphatase: 82 U/L (ref 39–117)
BUN: 14 mg/dL (ref 6–23)
CHLORIDE: 104 meq/L (ref 96–112)
CO2: 30 meq/L (ref 19–32)
Calcium: 9.3 mg/dL (ref 8.4–10.5)
Creatinine, Ser: 1.08 mg/dL (ref 0.40–1.50)
GFR: 90.3 mL/min (ref >60.00)
Glucose, Bld: 93 mg/dL (ref 70–99)
POTASSIUM: 3.6 meq/L (ref 3.5–5.1)
SODIUM: 141 meq/L (ref 135–145)
Total Bilirubin: 0.9 mg/dL (ref 0.2–1.2)
Total Protein: 7 g/dL (ref 6.0–8.3)

## 2016-07-08 LAB — LIPID PANEL
Cholesterol: 203 mg/dL — ABNORMAL HIGH (ref 0–200)
HDL: 49.8 mg/dL (ref >39.00)
LDL CALC: 127 mg/dL — AB (ref 0–99)
NONHDL: 153.28
Total CHOL/HDL Ratio: 4
Triglycerides: 131 mg/dL (ref 0.0–149.0)
VLDL: 26.2 mg/dL (ref 0.0–40.0)

## 2016-07-08 LAB — HEMOGLOBIN A1C: HEMOGLOBIN A1C: 5.4 % (ref 4.6–6.5)

## 2016-07-08 MED ORDER — TAMSULOSIN HCL 0.4 MG PO CAPS
0.4000 mg | ORAL_CAPSULE | Freq: Every day | ORAL | 1 refills | Status: DC
Start: 2016-07-08 — End: 2016-09-23

## 2016-07-08 NOTE — Progress Notes (Signed)
Patient presents to clinic today for annual exam.  Patient is fasting for labs. Body mass index is 32.5 kg/m.   Acute Concerns: Patient endorses occasional urinary hesitancy and decreased urinary stream. Denies nocturia, dysuria, hematuria. Denies urinary urgency and frequency. Denies fever, chills, malaise or fatigue. Has limited caffeine intake and increased water intake.  Chronic Issues: Hyperlipidemia -- + history. Not currently on medications. Is staying active to help with weight.   Health Maintenance: Immunizations -- declines flu shot. Tetanus up-to-date. Colonoscopy -- up-to-date    Past Medical History:  Diagnosis Date  . Chicken pox   . Hyperlipidemia 04/29/2011  . Hypertension   . Mumps   . Personal history of hyperthyroidism    "pt states his hyperthyroidism went away and hasn't had to take meds in 8 yrs."    Past Surgical History:  Procedure Laterality Date  . COLONOSCOPY  09/2009   hemorrhoids  . ROOT CANAL  1998  . WISDOM TOOTH EXTRACTION  1994    Current Outpatient Prescriptions on File Prior to Visit  Medication Sig Dispense Refill  . aspirin EC 81 MG tablet Take 81 mg by mouth daily.    . Multiple Vitamins-Minerals (MENS MULTIVITAMIN PLUS) TABS Take 1 each by mouth daily.     . naproxen sodium (ANAPROX) 220 MG tablet Take 220 mg by mouth daily as needed.     Marland Kitchen. omega-3 fish oil (MAXEPA) 1000 MG CAPS capsule Take 1 capsule by mouth every other day. As Directed    . [DISCONTINUED] quinapril (ACCUPRIL) 10 MG tablet Take 1 tablet (10 mg total) by mouth every morning. 30 tablet 5   No current facility-administered medications on file prior to visit.     No Known Allergies  Family History  Problem Relation Age of Onset  . Heart disease Mother     Living  . Hypertension Mother   . Cancer Father [id number]    lung-Deceased  . Dementia Maternal Grandfather   . Heart disease Maternal Aunt   . Hypertension Maternal Aunt   . Other Brother    Hyperglycemia/Pre-diabetes  . Healthy Sister     x2  . Healthy Brother     x1 [#2]  . Allergies Daughter   . Healthy Daughter     x1 [#2]    Social History   Social History  . Marital status: Married    Spouse name: N/A  . Number of children: 2  . Years of education: N/A   Occupational History  . DJ     self-employed   Social History Main Topics  . Smoking status: Never Smoker  . Smokeless tobacco: Never Used  . Alcohol use 3.6 oz/week    6 Cans of beer per week  . Drug use: No  . Sexual activity: Yes    Partners: Female   Other Topics Concern  . Not on file   Social History Narrative   Regular exercise:  Active at work. Walks 2 x weekly.   Caffeine use: 1 cup coffee daily   Building control surveyorLayout Engineer at [employer]                 Review of Systems  Constitutional: Negative for fever and weight loss.  HENT: Negative for ear discharge, ear pain, hearing loss and tinnitus.   Eyes: Negative for blurred vision, double vision, photophobia and pain.  Respiratory: Negative for cough and shortness of breath.   Cardiovascular: Negative for chest pain and palpitations.  Gastrointestinal: Negative for  abdominal pain, blood in stool, constipation, diarrhea, heartburn, melena, nausea and vomiting.  Genitourinary: Negative for dysuria, flank pain, frequency, hematuria and urgency.       + urinary hesitancy. Negative for nocturia.  Musculoskeletal: Negative for falls.  Neurological: Negative for dizziness, loss of consciousness and headaches.  Endo/Heme/Allergies: Negative for environmental allergies.  Psychiatric/Behavioral: Negative for depression, hallucinations, substance abuse and suicidal ideas. The patient is not nervous/anxious and does not have insomnia.     BP 136/78 (BP Location: Left Arm, Patient Position: Sitting, Cuff Size: Large)   Pulse 88   Temp 98.4 F (36.9 C) (Oral)   Resp 16   Ht 5\' 6"  (1.676 m)   Wt 201 lb 6 oz (91.3 kg)   SpO2 100%   BMI 32.50  kg/m   Physical Exam  Constitutional: He is oriented to person, place, and time and well-developed, well-nourished, and in no distress.  HENT:  Head: Normocephalic and atraumatic.  Right Ear: External ear normal.  Left Ear: External ear normal.  Nose: Nose normal.  Mouth/Throat: Oropharynx is clear and moist. No oropharyngeal exudate.  Eyes: Conjunctivae and EOM are normal. Pupils are equal, round, and reactive to light.  Neck: Neck supple. No thyromegaly present.  Cardiovascular: Normal rate, regular rhythm, normal heart sounds and intact distal pulses.   Pulmonary/Chest: Effort normal and breath sounds normal. No respiratory distress. He has no wheezes. He has no rales. He exhibits no tenderness.  Abdominal: Soft. Bowel sounds are normal. He exhibits no distension and no mass. There is no tenderness. There is no rebound and no guarding.  Genitourinary: Testes/scrotum normal.  Genitourinary Comments: Declines DRE  Lymphadenopathy:    He has no cervical adenopathy.  Neurological: He is alert and oriented to person, place, and time.  Skin: Skin is warm and dry. No rash noted.  Psychiatric: Affect normal.  Vitals reviewed.  Assessment/Plan: Benign prostatic hyperplasia with lower urinary tract symptoms Declines DRE. + symptoms. Will checks PSA today as part of physical for prostate cancer screening. If elevated will be sent to Urology. Will begin Flomax. FU scheduled.  Hyperlipidemia Will obtain fasting lipid panel.  Prostate cancer screening The natural history of prostate cancer and ongoing controversy regarding screening and potential treatment outcomes of prostate cancer has been discussed with the patient. The meaning of a false positive PSA and a false negative PSA has been discussed. He indicates understanding of the limitations of this screening test and wishes  to proceed with screening PSA testing.   Visit for preventive health examination Depression screen  negative. Health Maintenance reviewed -- Declines flu shot. Tetanus up-to-date. Colonoscopy up-to-date. Will check with insurance regarding hep C screening. Preventive schedule discussed and handout given in AVS. Will obtain fasting labs today.     Piedad Climes, PA-C

## 2016-07-08 NOTE — Patient Instructions (Signed)
Please go to the lab for blood work.   Our office will call you with your results unless you have chosen to receive results via MyChart.  If your blood work is normal we will follow-up each year for physicals and as scheduled for chronic medical problems.  If anything is abnormal we will treat accordingly and get you in for a follow-up.  Please start the Flomax in the evening as directed. Let me know how this does for you over the next month.  Preventive Care for Adults, Male A healthy lifestyle and preventive care can promote health and wellness. Preventive health guidelines for men include the following key practices:  A routine yearly physical is a good way to check with your health care provider about your health and preventative screening. It is a chance to share any concerns and updates on your health and to receive a thorough exam.  Visit your dentist for a routine exam and preventative care every 6 months. Brush your teeth twice a day and floss once a day. Good oral hygiene prevents tooth decay and gum disease.  The frequency of eye exams is based on your age, health, family medical history, use of contact lenses, and other factors. Follow your health care provider's recommendations for frequency of eye exams.  Eat a healthy diet. Foods such as vegetables, fruits, whole grains, low-fat dairy products, and lean protein foods contain the nutrients you need without too many calories. Decrease your intake of foods high in solid fats, added sugars, and salt. Eat the right amount of calories for you.Get information about a proper diet from your health care provider, if necessary.  Regular physical exercise is one of the most important things you can do for your health. Most adults should get at least 150 minutes of moderate-intensity exercise (any activity that increases your heart rate and causes you to sweat) each week. In addition, most adults need muscle-strengthening exercises on 2 or  more days a week.  Maintain a healthy weight. The body mass index (BMI) is a screening tool to identify possible weight problems. It provides an estimate of body fat based on height and weight. Your health care provider can find your BMI and can help you achieve or maintain a healthy weight.For adults 20 years and older:  A BMI below 18.5 is considered underweight.  A BMI of 18.5 to 24.9 is normal.  A BMI of 25 to 29.9 is considered overweight.  A BMI of 30 and above is considered obese.  Maintain normal blood lipids and cholesterol levels by exercising and minimizing your intake of saturated fat. Eat a balanced diet with plenty of fruit and vegetables. Blood tests for lipids and cholesterol should begin at age 40 and be repeated every 5 years. If your lipid or cholesterol levels are high, you are over 50, or you are at high risk for heart disease, you may need your cholesterol levels checked more frequently.Ongoing high lipid and cholesterol levels should be treated with medicines if diet and exercise are not working.  If you smoke, find out from your health care provider how to quit. If you do not use tobacco, do not start.  Lung cancer screening is recommended for adults aged 15-80 years who are at high risk for developing lung cancer because of a history of smoking. A yearly low-dose CT scan of the lungs is recommended for people who have at least a 30-pack-year history of smoking and are a current smoker or have quit within  the past 15 years. A pack year of smoking is smoking an average of 1 pack of cigarettes a day for 1 year (for example: 1 pack a day for 30 years or 2 packs a day for 15 years). Yearly screening should continue until the smoker has stopped smoking for at least 15 years. Yearly screening should be stopped for people who develop a health problem that would prevent them from having lung cancer treatment.  If you choose to drink alcohol, do not have more than 2 drinks per day.  One drink is considered to be 12 ounces (355 mL) of beer, 5 ounces (148 mL) of wine, or 1.5 ounces (44 mL) of liquor.  Avoid use of street drugs. Do not share needles with anyone. Ask for help if you need support or instructions about stopping the use of drugs.  High blood pressure causes heart disease and increases the risk of stroke. Your blood pressure should be checked at least every 1-2 years. Ongoing high blood pressure should be treated with medicines, if weight loss and exercise are not effective.  If you are 66-43 years old, ask your health care provider if you should take aspirin to prevent heart disease.  Diabetes screening is done by taking a blood sample to check your blood glucose level after you have not eaten for a certain period of time (fasting). If you are not overweight and you do not have risk factors for diabetes, you should be screened once every 3 years starting at age 75. If you are overweight or obese and you are 53-24 years of age, you should be screened for diabetes every year as part of your cardiovascular risk assessment.  Colorectal cancer can be detected and often prevented. Most routine colorectal cancer screening begins at the age of 73 and continues through age 9. However, your health care provider may recommend screening at an earlier age if you have risk factors for colon cancer. On a yearly basis, your health care provider may provide home test kits to check for hidden blood in the stool. Use of a small camera at the end of a tube to directly examine the colon (sigmoidoscopy or colonoscopy) can detect the earliest forms of colorectal cancer. Talk to your health care provider about this at age 3, when routine screening begins. Direct exam of the colon should be repeated every 5-10 years through age 82, unless early forms of precancerous polyps or small growths are found.  People who are at an increased risk for hepatitis B should be screened for this virus. You are  considered at high risk for hepatitis B if:  You were born in a country where hepatitis B occurs often. Talk with your health care provider about which countries are considered high risk.  Your parents were born in a high-risk country and you have not received a shot to protect against hepatitis B (hepatitis B vaccine).  You have HIV or AIDS.  You use needles to inject street drugs.  You live with, or have sex with, someone who has hepatitis B.  You are a man who has sex with other men (MSM).  You get hemodialysis treatment.  You take certain medicines for conditions such as cancer, organ transplantation, and autoimmune conditions.  Hepatitis C blood testing is recommended for all people born from [birth year] through 1965 and any individual with known risks for hepatitis C.  Practice safe sex. Use condoms and avoid high-risk sexual practices to reduce the spread of sexually transmitted  infections (STIs). STIs include gonorrhea, chlamydia, syphilis, trichomonas, herpes, HPV, and human immunodeficiency virus (HIV). Herpes, HIV, and HPV are viral illnesses that have no cure. They can result in disability, cancer, and death.  If you are a man who has sex with other men, you should be screened at least once per year for:  HIV.  Urethral, rectal, and pharyngeal infection of gonorrhea, chlamydia, or both.  If you are at risk of being infected with HIV, it is recommended that you take a prescription medicine daily to prevent HIV infection. This is called preexposure prophylaxis (PrEP). You are considered at risk if:  You are a man who has sex with other men (MSM) and have other risk factors.  You are a heterosexual man, are sexually active, and are at increased risk for HIV infection.  You take drugs by injection.  You are sexually active with a partner who has HIV.  Talk with your health care provider about whether you are at high risk of being infected with HIV. If you choose to begin PrEP,  you should first be tested for HIV. You should then be tested every 3 months for as long as you are taking PrEP.  A one-time screening for abdominal aortic aneurysm (AAA) and surgical repair of large AAAs by ultrasound are recommended for men ages 66 to 59 years who are current or former smokers.  Healthy men should no longer receive prostate-specific antigen (PSA) blood tests as part of routine cancer screening. Talk with your health care provider about prostate cancer screening.  Testicular cancer screening is not recommended for adult males who have no symptoms. Screening includes self-exam, a health care provider exam, and other screening tests. Consult with your health care provider about any symptoms you have or any concerns you have about testicular cancer.  Use sunscreen. Apply sunscreen liberally and repeatedly throughout the day. You should seek shade when your shadow is shorter than you. Protect yourself by wearing long sleeves, pants, a wide-brimmed hat, and sunglasses year round, whenever you are outdoors.  Once a month, do a whole-body skin exam, using a mirror to look at the skin on your back. Tell your health care provider about new moles, moles that have irregular borders, moles that are larger than a pencil eraser, or moles that have changed in shape or color.  Stay current with required vaccines (immunizations).  Influenza vaccine. All adults should be immunized every year.  Tetanus, diphtheria, and acellular pertussis (Td, Tdap) vaccine. An adult who has not previously received Tdap or who does not know his vaccine status should receive 1 dose of Tdap. This initial dose should be followed by tetanus and diphtheria toxoids (Td) booster doses every 10 years. Adults with an unknown or incomplete history of completing a 3-dose immunization series with Td-containing vaccines should begin or complete a primary immunization series including a Tdap dose. Adults should receive a Td booster  every 10 years.  Varicella vaccine. An adult without evidence of immunity to varicella should receive 2 doses or a second dose if he has previously received 1 dose.  Human papillomavirus (HPV) vaccine. Males aged 11-21 years who have not received the vaccine previously should receive the 3-dose series. Males aged 22-26 years may be immunized. Immunization is recommended through the age of 42 years for any male who has sex with males and did not get any or all doses earlier. Immunization is recommended for any person with an immunocompromised condition through the age of 46 years  if he did not get any or all doses earlier. During the 3-dose series, the second dose should be obtained 4-8 weeks after the first dose. The third dose should be obtained 24 weeks after the first dose and 16 weeks after the second dose.  Zoster vaccine. One dose is recommended for adults aged 42 years or older unless certain conditions are present.  Measles, mumps, and rubella (MMR) vaccine. Adults born before [birth year] generally are considered immune to measles and mumps. Adults born in [birth year] or later should have 1 or more doses of MMR vaccine unless there is a contraindication to the vaccine or there is laboratory evidence of immunity to each of the three diseases. A routine second dose of MMR vaccine should be obtained at least 28 days after the first dose for students attending postsecondary schools, health care workers, or international travelers. People who received inactivated measles vaccine or an unknown type of measles vaccine during 1963-1967 should receive 2 doses of MMR vaccine. People who received inactivated mumps vaccine or an unknown type of mumps vaccine before 1979 and are at high risk for mumps infection should consider immunization with 2 doses of MMR vaccine. Unvaccinated health care workers born before [birth year] who lack laboratory evidence of measles, mumps, or rubella immunity or laboratory confirmation of disease  should consider measles and mumps immunization with 2 doses of MMR vaccine or rubella immunization with 1 dose of MMR vaccine.  Pneumococcal 13-valent conjugate (PCV13) vaccine. When indicated, a person who is uncertain of his immunization history and has no record of immunization should receive the PCV13 vaccine. All adults 25 years of age and older should receive this vaccine. An adult aged 34 years or older who has certain medical conditions and has not been previously immunized should receive 1 dose of PCV13 vaccine. This PCV13 should be followed with a dose of pneumococcal polysaccharide (PPSV23) vaccine. Adults who are at high risk for pneumococcal disease should obtain the PPSV23 vaccine at least 8 weeks after the dose of PCV13 vaccine. Adults older than 58 years of age who have normal immune system function should obtain the PPSV23 vaccine dose at least 1 year after the dose of PCV13 vaccine.  Pneumococcal polysaccharide (PPSV23) vaccine. When PCV13 is also indicated, PCV13 should be obtained first. All adults aged 53 years and older should be immunized. An adult younger than age 73 years who has certain medical conditions should be immunized. Any person who resides in a nursing home or long-term care facility should be immunized. An adult smoker should be immunized. People with an immunocompromised condition and certain other conditions should receive both PCV13 and PPSV23 vaccines. People with human immunodeficiency virus (HIV) infection should be immunized as soon as possible after diagnosis. Immunization during chemotherapy or radiation therapy should be avoided. Routine use of PPSV23 vaccine is not recommended for American Indians, [place] Natives, or people younger than 65 years unless there are medical conditions that require PPSV23 vaccine. When indicated, people who have unknown immunization and have no record of immunization should receive PPSV23 vaccine. One-time revaccination 5 years after the  first dose of PPSV23 is recommended for people aged 19-64 years who have chronic kidney failure, nephrotic syndrome, asplenia, or immunocompromised conditions. People who received 1-2 doses of PPSV23 before age 63 years should receive another dose of PPSV23 vaccine at age 76 years or later if at least 5 years have passed since the previous dose. Doses of PPSV23 are not needed for people immunized with PPSV23 at  or after age 77 years.  Meningococcal vaccine. Adults with asplenia or persistent complement component deficiencies should receive 2 doses of quadrivalent meningococcal conjugate (MenACWY-D) vaccine. The doses should be obtained at least 2 months apart. Microbiologists working with certain meningococcal bacteria, [employer] recruits, people at risk during an outbreak, and people who travel to or live in countries with a high rate of meningitis should be immunized. A first-year college student up through age 16 years who is living in a residence hall should receive a dose if he did not receive a dose on or after his 16th birthday. Adults who have certain high-risk conditions should receive one or more doses of vaccine.  Hepatitis A vaccine. Adults who wish to be protected from this disease, have chronic liver disease, work with hepatitis A-infected animals, work in hepatitis A research labs, or travel to or work in countries with a high rate of hepatitis A should be immunized. Adults who were previously unvaccinated and who anticipate close contact with an international adoptee during the first 60 days after arrival in the [place] from a country with a high rate of hepatitis A should be immunized.  Hepatitis B vaccine. Adults should be immunized if they wish to be protected from this disease, are under age 10 years and have diabetes, have chronic liver disease, have had more than one sex partner in the past 6 months, may be exposed to blood or other infectious body fluids, are household contacts or  sex partners of hepatitis B positive people, are clients or workers in certain care facilities, or travel to or work in countries with a high rate of hepatitis B.  Haemophilus influenzae type b (Hib) vaccine. A previously unvaccinated person with asplenia or sickle cell disease or having a scheduled splenectomy should receive 1 dose of Hib vaccine. Regardless of previous immunization, a recipient of a hematopoietic stem cell transplant should receive a 3-dose series 6-12 months after his successful transplant. Hib vaccine is not recommended for adults with HIV infection. Preventive Service / Frequency Ages 2 to 68  Blood pressure check.** / Every 3-5 years.  Lipid and cholesterol check.** / Every 5 years beginning at age 11.  Hepatitis C blood test.** / For any individual with known risks for hepatitis C.  Skin self-exam. / Monthly.  Influenza vaccine. / Every year.  Tetanus, diphtheria, and acellular pertussis (Tdap, Td) vaccine.** / Consult your health care provider. 1 dose of Td every 10 years.  Varicella vaccine.** / Consult your health care provider.  HPV vaccine. / 3 doses over 6 months, if 69 or younger.  Measles, mumps, rubella (MMR) vaccine.** / You need at least 1 dose of MMR if you were born in 1957 or later. You may also need a second dose.  Pneumococcal 13-valent conjugate (PCV13) vaccine.** / Consult your health care provider.  Pneumococcal polysaccharide (PPSV23) vaccine.** / 1 to 2 doses if you smoke cigarettes or if you have certain conditions.  Meningococcal vaccine.** / 1 dose if you are age 62 to 91 years and a Market researcher living in a residence hall, or have one of several medical conditions. You may also need additional booster doses.  Hepatitis A vaccine.** / Consult your health care provider.  Hepatitis B vaccine.** / Consult your health care provider.  Haemophilus influenzae type b (Hib) vaccine.** / Consult your health care provider. Ages 47  to 71  Blood pressure check.** / Every year.  Lipid and cholesterol check.** / Every 5 years beginning at  age 65.  Lung cancer screening. / Every year if you are aged 70-80 years and have a 30-pack-year history of smoking and currently smoke or have quit within the past 15 years. Yearly screening is stopped once you have quit smoking for at least 15 years or develop a health problem that would prevent you from having lung cancer treatment.  Fecal occult blood test (FOBT) of stool. / Every year beginning at age 28 and continuing until age 70. You may not have to do this test if you get a colonoscopy every 10 years.  Flexible sigmoidoscopy** or colonoscopy.** / Every 5 years for a flexible sigmoidoscopy or every 10 years for a colonoscopy beginning at age 32 and continuing until age 21.  Hepatitis C blood test.** / For all people born from [birth year] through 1965 and any individual with known risks for hepatitis C.  Skin self-exam. / Monthly.  Influenza vaccine. / Every year.  Tetanus, diphtheria, and acellular pertussis (Tdap/Td) vaccine.** / Consult your health care provider. 1 dose of Td every 10 years.  Varicella vaccine.** / Consult your health care provider.  Zoster vaccine.** / 1 dose for adults aged 75 years or older.  Measles, mumps, rubella (MMR) vaccine.** / You need at least 1 dose of MMR if you were born in 1957 or later. You may also need a second dose.  Pneumococcal 13-valent conjugate (PCV13) vaccine.** / Consult your health care provider.  Pneumococcal polysaccharide (PPSV23) vaccine.** / 1 to 2 doses if you smoke cigarettes or if you have certain conditions.  Meningococcal vaccine.** / Consult your health care provider.  Hepatitis A vaccine.** / Consult your health care provider.  Hepatitis B vaccine.** / Consult your health care provider.  Haemophilus influenzae type b (Hib) vaccine.** / Consult your health care provider. Ages 38 and over  Blood pressure check.** /  Every year.  Lipid and cholesterol check.**/ Every 5 years beginning at age 42.  Lung cancer screening. / Every year if you are aged 22-80 years and have a 30-pack-year history of smoking and currently smoke or have quit within the past 15 years. Yearly screening is stopped once you have quit smoking for at least 15 years or develop a health problem that would prevent you from having lung cancer treatment.  Fecal occult blood test (FOBT) of stool. / Every year beginning at age 87 and continuing until age 34. You may not have to do this test if you get a colonoscopy every 10 years.  Flexible sigmoidoscopy** or colonoscopy.** / Every 5 years for a flexible sigmoidoscopy or every 10 years for a colonoscopy beginning at age 41 and continuing until age 41.  Hepatitis C blood test.** / For all people born from [birth year] through 1963 and any individual with known risks for hepatitis C.  Abdominal aortic aneurysm (AAA) screening.** / A one-time screening for ages 24 to 35 years who are current or former smokers.  Skin self-exam. / Monthly.  Influenza vaccine. / Every year.  Tetanus, diphtheria, and acellular pertussis (Tdap/Td) vaccine.** / 1 dose of Td every 10 years.  Varicella vaccine.** / Consult your health care provider.  Zoster vaccine.** / 1 dose for adults aged 65 years or older.  Pneumococcal 13-valent conjugate (PCV13) vaccine.** / 1 dose for all adults aged 45 years and older.  Pneumococcal polysaccharide (PPSV23) vaccine.** / 1 dose for all adults aged 31 years and older.  Meningococcal vaccine.** / Consult your health care provider.  Hepatitis A vaccine.** / Consult your health care  provider.  Hepatitis B vaccine.** / Consult your health care provider.  Haemophilus influenzae type b (Hib) vaccine.** / Consult your health care provider. **Family history and personal history of risk and conditions may change your health care provider's recommendations.   This information is not  intended to replace advice given to you by your health care provider. Make sure you discuss any questions you have with your health care provider.   Document Released: 10/25/2001 Document Revised: 09/19/2014 Document Reviewed: 01/24/2011 Elsevier Interactive Patient Education [employer].

## 2016-07-09 DIAGNOSIS — Z125 Encounter for screening for malignant neoplasm of prostate: Secondary | ICD-10-CM | POA: Insufficient documentation

## 2016-07-09 DIAGNOSIS — N401 Enlarged prostate with lower urinary tract symptoms: Secondary | ICD-10-CM | POA: Insufficient documentation

## 2016-07-09 NOTE — Assessment & Plan Note (Signed)
Will obtain fasting lipid panel 

## 2016-07-09 NOTE — Assessment & Plan Note (Signed)
The natural history of prostate cancer and ongoing controversy regarding screening and potential treatment outcomes of prostate cancer has been discussed with the patient. The meaning of a false positive PSA and a false negative PSA has been discussed. He indicates understanding of the limitations of this screening test and wishes  to proceed with screening PSA testing.  

## 2016-07-09 NOTE — Assessment & Plan Note (Signed)
Depression screen negative. Health Maintenance reviewed -- Declines flu shot. Tetanus up-to-date. Colonoscopy up-to-date. Will check with insurance regarding hep C screening. Preventive schedule discussed and handout given in AVS. Will obtain fasting labs today.

## 2016-07-09 NOTE — Assessment & Plan Note (Signed)
Declines DRE. + symptoms. Will checks PSA today as part of physical for prostate cancer screening. If elevated will be sent to Urology. Will begin Flomax. FU scheduled.

## 2016-07-11 LAB — TSH: TSH: 1.5 u[IU]/mL (ref 0.35–4.50)

## 2016-07-11 LAB — PSA: PSA: 2.44 ng/mL (ref 0.10–4.00)

## 2016-09-23 ENCOUNTER — Other Ambulatory Visit: Payer: Self-pay | Admitting: Physician Assistant

## 2016-09-23 DIAGNOSIS — N401 Enlarged prostate with lower urinary tract symptoms: Secondary | ICD-10-CM

## 2016-09-27 ENCOUNTER — Ambulatory Visit: Payer: BLUE CROSS/BLUE SHIELD

## 2016-10-07 ENCOUNTER — Ambulatory Visit (INDEPENDENT_AMBULATORY_CARE_PROVIDER_SITE_OTHER): Payer: BLUE CROSS/BLUE SHIELD | Admitting: Behavioral Health

## 2016-10-07 DIAGNOSIS — Z23 Encounter for immunization: Secondary | ICD-10-CM | POA: Diagnosis not present

## 2016-12-26 ENCOUNTER — Other Ambulatory Visit: Payer: Self-pay | Admitting: Emergency Medicine

## 2016-12-26 DIAGNOSIS — N401 Enlarged prostate with lower urinary tract symptoms: Secondary | ICD-10-CM

## 2016-12-26 MED ORDER — TAMSULOSIN HCL 0.4 MG PO CAPS: ORAL_CAPSULE | ORAL | 0 refills | Status: DC

## 2017-02-23 ENCOUNTER — Other Ambulatory Visit: Payer: Self-pay | Admitting: Physician Assistant

## 2017-02-23 DIAGNOSIS — N401 Enlarged prostate with lower urinary tract symptoms: Secondary | ICD-10-CM

## 2017-02-23 NOTE — Telephone Encounter (Signed)
Patient is due for 6 month follow up. Please call to schedule an appointment.

## 2017-02-23 NOTE — Telephone Encounter (Signed)
LMOVM advising patient he is past due for follow up appointment. Need refill of tamsulosin.

## 2017-03-19 ENCOUNTER — Other Ambulatory Visit: Payer: Self-pay | Admitting: Physician Assistant

## 2017-03-19 DIAGNOSIS — N401 Enlarged prostate with lower urinary tract symptoms: Secondary | ICD-10-CM

## 2017-03-20 NOTE — Telephone Encounter (Signed)
Patient is due for an 6 month follow up appointment

## 2017-06-20 ENCOUNTER — Telehealth: Payer: Self-pay | Admitting: Physician Assistant

## 2017-06-20 NOTE — Telephone Encounter (Signed)
Patient would like to transfer from [place] to [place] due to location being far, please advise

## 2017-06-20 NOTE — Telephone Encounter (Signed)
Ok with me 

## 2017-06-21 NOTE — Telephone Encounter (Signed)
Patient scheduled with NP for 08/14/2017

## 2017-08-14 ENCOUNTER — Ambulatory Visit (INDEPENDENT_AMBULATORY_CARE_PROVIDER_SITE_OTHER): Payer: BLUE CROSS/BLUE SHIELD | Admitting: Family

## 2017-08-14 ENCOUNTER — Encounter: Payer: Self-pay | Admitting: Family

## 2017-08-14 VITALS — BP 140/90 | HR 74 | Temp 98.5°F | Resp 16 | Ht 66.0 in | Wt 205.0 lb

## 2017-08-14 DIAGNOSIS — N400 Enlarged prostate without lower urinary tract symptoms: Secondary | ICD-10-CM | POA: Diagnosis not present

## 2017-08-14 DIAGNOSIS — R21 Rash and other nonspecific skin eruption: Secondary | ICD-10-CM

## 2017-08-14 DIAGNOSIS — Z0000 Encounter for general adult medical examination without abnormal findings: Secondary | ICD-10-CM | POA: Diagnosis not present

## 2017-08-14 DIAGNOSIS — Z23 Encounter for immunization: Secondary | ICD-10-CM | POA: Diagnosis not present

## 2017-08-14 DIAGNOSIS — E785 Hyperlipidemia, unspecified: Secondary | ICD-10-CM | POA: Diagnosis not present

## 2017-08-14 LAB — URINALYSIS, ROUTINE W REFLEX MICROSCOPIC
BILIRUBIN URINE: NEGATIVE
Hgb urine dipstick: NEGATIVE
Ketones, ur: NEGATIVE
LEUKOCYTES UA: NEGATIVE
Nitrite: NEGATIVE
PH: 6.5 (ref 5.0–8.0)
RBC / HPF: NONE SEEN (ref 0–?)
Total Protein, Urine: NEGATIVE
Urine Glucose: NEGATIVE
Urobilinogen, UA: 0.2 (ref 0.0–1.0)
WBC, UA: NONE SEEN (ref 0–?)

## 2017-08-14 LAB — CBC WITH DIFFERENTIAL/PLATELET
BASOS PCT: 0.4 % (ref 0.0–3.0)
Basophils Absolute: 0 10*3/uL (ref 0.0–0.1)
EOS ABS: 0.1 10*3/uL (ref 0.0–0.7)
Eosinophils Relative: 1.9 % (ref 0.0–5.0)
HCT: 48.8 % (ref 39.0–52.0)
Hemoglobin: 15.7 g/dL (ref 13.0–17.0)
LYMPHS ABS: 1.2 10*3/uL (ref 0.7–4.0)
LYMPHS PCT: 35.5 % (ref 12.0–46.0)
MCHC: 32.1 g/dL (ref 30.0–36.0)
MCV: 86.2 fl (ref 78.0–100.0)
MONO ABS: 0.3 10*3/uL (ref 0.1–1.0)
Monocytes Relative: 9.7 % (ref 3.0–12.0)
NEUTROS ABS: 1.8 10*3/uL (ref 1.4–7.7)
NEUTROS PCT: 52.5 % (ref 43.0–77.0)
PLATELETS: 252 10*3/uL (ref 150.0–400.0)
RBC: 5.66 Mil/uL (ref 4.22–5.81)
RDW: 15.1 % (ref 11.5–15.5)
WBC: 3.4 10*3/uL — ABNORMAL LOW (ref 4.0–10.5)

## 2017-08-14 LAB — TSH: TSH: 2.08 u[IU]/mL (ref 0.35–4.50)

## 2017-08-14 MED ORDER — ZOSTER VAC RECOMB ADJUVANTED 50 MCG/0.5ML IM SUSR: INTRAMUSCULAR | 1 refills | Status: DC

## 2017-08-14 MED ORDER — TAMSULOSIN HCL 0.4 MG PO CAPS: 0.4000 mg | ORAL_CAPSULE | Freq: Every day | ORAL | 3 refills | Status: DC

## 2017-08-14 NOTE — Progress Notes (Signed)
Subjective:    Patient ID: Joel Chen, male    DOB: 27-Jul-1956, 61 y.o.   MRN: [id number]  HPI  [name] is a 61 yr old male who presents today for follow up.  1) HTN- not on antihypertensive currently.  BP Readings from Last 3 Encounters:  08/14/17 (!) 145/87  07/08/16 136/78  07/06/15 132/88   2) Hyperlipidemia- not on statin.  Lab Results  Component Value Date   CHOL 203 (H) 07/08/2016   HDL 49.80 07/08/2016   LDLCALC 127 (H) 07/08/2016   LDLDIRECT 143.0 07/06/2015   TRIG 131.0 07/08/2016   CHOLHDL 4 07/08/2016   3) BPH- no longer taking flomax   pygeum bark, denies symptoms.  Denies frequency.    Lab Results  Component Value Date   PSA 2.44 07/08/2016   PSA 1.87 07/06/2015   PSA 2.07 11/13/2013   4) skin rash- back of head, requests referral back to derm.  Preventative care-  Patient presents today for complete physical.  Immunizations: flu shot due, tetanus due, shingrix due  Diet: reports diet is healthy Exercise:  Walks the dogs daily Colonoscopy: 09/12/09 Wt Readings from Last 3 Encounters:  08/14/17 205 lb (93 kg)  07/08/16 201 lb 6 oz (91.3 kg)  07/06/15 207 lb (93.9 kg)    Review of Systems  Constitutional: Negative for unexpected weight change.  HENT: Negative for hearing loss and rhinorrhea.   Eyes: Negative for visual disturbance.  Respiratory: Negative for cough.   Cardiovascular: Negative for leg swelling.  Gastrointestinal: Negative for blood in stool and constipation.  Genitourinary: Negative for dysuria and frequency.  Musculoskeletal: Negative for arthralgias.  Skin: Negative for rash.  Neurological: Negative for headaches.  Hematological: Negative for adenopathy.  Psychiatric/Behavioral:       Mood is good.   Past Medical History:  Diagnosis Date  . Chicken pox   . Hyperlipidemia 04/29/2011  . Hypertension   . Mumps   . Personal history of hyperthyroidism    "pt states his hyperthyroidism went away and hasn't had to take  meds in 8 yrs."     Social History   Socioeconomic History  . Marital status: Married    Spouse name: Not on file  . Number of children: 2  . Years of education: Not on file  . Highest education level: Not on file  Social Needs  . Financial resource strain: Not on file  . Food insecurity - worry: Not on file  . Food insecurity - inability: Not on file  . Transportation needs - medical: Not on file  . Transportation needs - non-medical: Not on file  Occupational History  . Occupation: DJ    Comment: self-employed  Tobacco Use  . Smoking status: Never Smoker  . Smokeless tobacco: Never Used  Substance and Sexual Activity  . Alcohol use: Yes    Alcohol/week: 3.6 oz    Types: 6 Cans of beer per week  . Drug use: No  . Sexual activity: Yes    Partners: Female  Other Topics Concern  . Not on file  Social History Narrative   Regular exercise:  Active at work. Walks 2 x weekly.   Caffeine use: 1 cup coffee daily   Building control surveyorLayout Engineer at [employer]                 Past Surgical History:  Procedure Laterality Date  . COLONOSCOPY  09/2009   hemorrhoids  . ROOT CANAL  1998  . WISDOM TOOTH  EXTRACTION  1994    Family History  Problem Relation Age of Onset  . Heart disease Mother        Living  . Hypertension Mother   . Cancer Father 33       lung-Deceased  . Dementia Maternal Grandfather   . Heart disease Maternal Aunt   . Hypertension Maternal Aunt   . Other Brother        Hyperglycemia/Pre-diabetes  . Healthy Sister        x2  . Healthy Brother        x1 [#2]  . Allergies Daughter     No Known Allergies  Current Outpatient Medications on File Prior to Visit  Medication Sig Dispense Refill  . aspirin EC 81 MG tablet Take 81 mg by mouth daily.    . Multiple Vitamins-Minerals (MENS MULTIVITAMIN PLUS) TABS Take 1 each by mouth daily.     . naproxen sodium (ANAPROX) 220 MG tablet Take 220 mg by mouth daily as needed.     Marland Kitchen omega-3 fish oil (MAXEPA) 1000 MG  CAPS capsule Take 1 capsule by mouth every other day. As Directed    . [DISCONTINUED] quinapril (ACCUPRIL) 10 MG tablet Take 1 tablet (10 mg total) by mouth every morning. 30 tablet 5   No current facility-administered medications on file prior to visit.     BP (!) 145/87 (BP Location: Left Arm, Patient Position: Sitting, Cuff Size: Large)   Pulse 74   Temp 98.5 F (36.9 C) (Oral)   Resp 16   Ht 5\' 6"  (1.676 m)   Wt 205 lb (93 kg)   SpO2 98%   BMI 33.09 kg/m       Objective:   Physical Exam Physical Exam  Constitutional: He is oriented to person, place, and time. He appears well-developed and well-nourished. No distress.  HENT:  Head: Normocephalic and atraumatic.  Right Ear: Tympanic membrane and ear canal normal.  Left Ear: Tympanic membrane and ear canal normal.  Mouth/Throat: Oropharynx is clear and moist.  Eyes: Pupils are equal, round, and reactive to light. No scleral icterus.  Neck: Normal range of motion. No thyromegaly present.  Cardiovascular: Normal rate and regular rhythm.   No murmur heard. Pulmonary/Chest: Effort normal and breath sounds normal. No respiratory distress. He has no wheezes. He has no rales. He exhibits no tenderness.  Abdominal: Soft. Bowel sounds are normal. He exhibits no distension and no mass. There is no tenderness. There is no rebound and no guarding.  Musculoskeletal: He exhibits no edema.  Lymphadenopathy:    He has no cervical adenopathy.  Neurological: He is alert and oriented to person, place, and time. He has normal patellar reflexes. He exhibits normal muscle tone. Coordination normal.  Skin: Skin is warm and dry. rash noted posterior scalp Psychiatric: He has a normal mood and affect. His behavior is normal. Judgment and thought content normal.           Assessment & Plan:  Preventative care- discussed healthy diet, exercise, weight loss. Since I last saw him he lost his daughter to MVA. He is doing well overall. Support  provided. Obtain routine lab work. Flu shot and Tdap today.  rx sent to his pharmacy for shingrix.EKG tracing is personally reviewed.  EKG notes NSR.  No acute changes.   HTN- BP mildly elevated, discussed low sodium diet, exercise, weight loss. Repeat bp in 3 months.  BPH- wishes to restart flomax and stop supplement. rx sent.   Hyperlipidemia-  repeat lipid panel.   Skin rash- has rash on back of scalp.requests referral back to dematology.        Assessment & Plan:

## 2017-08-14 NOTE — Patient Instructions (Signed)
Please complete lab work prior to leaving. Work on healthy low sodium diet, regular exercise and weight loss.

## 2017-08-15 ENCOUNTER — Encounter: Payer: Self-pay | Admitting: Family

## 2017-08-15 LAB — BASIC METABOLIC PANEL
BUN: 11 mg/dL (ref 6–23)
CALCIUM: 9.6 mg/dL (ref 8.4–10.5)
CHLORIDE: 104 meq/L (ref 96–112)
CO2: 28 meq/L (ref 19–32)
CREATININE: 1.12 mg/dL (ref 0.40–1.50)
GFR: 86.26 mL/min (ref >60.00)
GLUCOSE: 91 mg/dL (ref 70–99)
Potassium: 4.5 mEq/L (ref 3.5–5.1)
Sodium: 141 mEq/L (ref 135–145)

## 2017-08-15 LAB — HEPATIC FUNCTION PANEL
ALK PHOS: 68 U/L (ref 39–117)
ALT: 25 U/L (ref 0–53)
AST: 19 U/L (ref 0–37)
Albumin: 4.5 g/dL (ref 3.5–5.2)
BILIRUBIN DIRECT: 0.1 mg/dL (ref 0.0–0.3)
Total Bilirubin: 0.9 mg/dL (ref 0.2–1.2)
Total Protein: 7.2 g/dL (ref 6.0–8.3)

## 2017-08-15 LAB — LIPID PANEL
CHOL/HDL RATIO: 4
Cholesterol: 226 mg/dL — ABNORMAL HIGH (ref 0–200)
HDL: 51.3 mg/dL (ref >39.00)
LDL Cholesterol: 145 mg/dL — ABNORMAL HIGH (ref 0–99)
NONHDL: 174.88
Triglycerides: 148 mg/dL (ref 0.0–149.0)
VLDL: 29.6 mg/dL (ref 0.0–40.0)

## 2017-08-24 ENCOUNTER — Telehealth: Payer: Self-pay | Admitting: Family

## 2017-08-24 NOTE — Telephone Encounter (Signed)
Copied from CRM [id number]. Topic: Referral - Request >> Aug 24, 2017 10:05 AM Joel Chen, Rosey Batheresa D wrote: Reason for CRM: Patient called and said that the dermatology appt he was refer too is not the one he told the provider. He needs is dermatology referral to [facility] [place] [facility] on [address] and the provider there is Joneen CarawayElyshia Warden. Please call patient back with any question, thanks.

## 2017-11-13 ENCOUNTER — Ambulatory Visit (INDEPENDENT_AMBULATORY_CARE_PROVIDER_SITE_OTHER): Payer: BLUE CROSS/BLUE SHIELD | Admitting: Family

## 2017-11-13 ENCOUNTER — Encounter: Payer: Self-pay | Admitting: Family

## 2017-11-13 VITALS — BP 155/84 | HR 72 | Temp 98.4°F | Resp 16 | Ht 66.0 in | Wt 208.6 lb

## 2017-11-13 DIAGNOSIS — E785 Hyperlipidemia, unspecified: Secondary | ICD-10-CM | POA: Diagnosis not present

## 2017-11-13 DIAGNOSIS — I10 Essential (primary) hypertension: Secondary | ICD-10-CM

## 2017-11-13 DIAGNOSIS — M25511 Pain in right shoulder: Secondary | ICD-10-CM | POA: Diagnosis not present

## 2017-11-13 DIAGNOSIS — N400 Enlarged prostate without lower urinary tract symptoms: Secondary | ICD-10-CM

## 2017-11-13 NOTE — Progress Notes (Signed)
Subjective:    Patient ID: Joel Chen, male    DOB: 02/01/1956, 62 y.o.   MRN: [id number]  HPI  Patient is a 62 year old male who presents today for routine follow-up.  hypertension-not currently maintained on antihypertensive medication.  BP Readings from Last 3 Encounters:  11/13/17 (!) 155/84  08/14/17 140/90  07/08/16 136/78   Hyperlipidemia-not currently on statin. Lab Results  Component Value Date   CHOL 226 (H) 08/14/2017   HDL 51.30 08/14/2017   LDLCALC 145 (H) 08/14/2017   LDLDIRECT 143.0 07/06/2015   TRIG 148.0 08/14/2017   CHOLHDL 4 08/14/2017   BPH- denies voiding injuries.  Lab Results  Component Value Date   PSA 2.44 07/08/2016   PSA 1.87 07/06/2015   PSA 2.07 11/13/2013   R shoulder pain- fell yesterday while walking the dog.  "can hardly lift my arm." this occurred yesterday.    Of note, the patient reports that he never did follow through with a sleep study which was ordered a few years back.  He reports that his wife has not noted any recent apnea and that he wakes up feeling refreshed.    Review of Systems See HPI  Past Medical History:  Diagnosis Date  . Chicken pox   . Hyperlipidemia 04/29/2011  . Hypertension   . Mumps   . Personal history of hyperthyroidism    "pt states his hyperthyroidism went away and hasn't had to take meds in 8 yrs."     Social History   Socioeconomic History  . Marital status: Married    Spouse name: Not on file  . Number of children: 2  . Years of education: Not on file  . Highest education level: Not on file  Social Needs  . Financial resource strain: Not on file  . Food insecurity - worry: Not on file  . Food insecurity - inability: Not on file  . Transportation needs - medical: Not on file  . Transportation needs - non-medical: Not on file  Occupational History  . Occupation: DJ    Comment: self-employed  Tobacco Use  . Smoking status: Passive Smoke Exposure - Never Smoker  . Smokeless  tobacco: Never Used  Substance and Sexual Activity  . Alcohol use: Yes    Alcohol/week: 3.6 oz    Types: 6 Cans of beer per week  . Drug use: No  . Sexual activity: Yes    Partners: Female  Other Topics Concern  . Not on file  Social History Narrative   Regular exercise:  Active at work. Walks 2 x weekly.   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 COLONOSCOPY  09/2009   hemorrhoids  . ROOT CANAL  1998  . WISDOM TOOTH EXTRACTION  1994    Family History  Problem Relation Age of Onset  . Heart disease Mother        Living  . Hypertension Mother   . Cancer Father 36       lung-Deceased  . Dementia Maternal Grandfather   . Heart disease Maternal Aunt   . Hypertension Maternal Aunt   . Other Brother        Hyperglycemia/Pre-diabetes  . Healthy Sister        x2  . Healthy  Brother        x1 [#2]  . Allergies Daughter     No Known Allergies  Current Outpatient Medications on File Prior to Visit  Medication Sig Dispense Refill  . Multiple Vitamins-Minerals (MENS MULTIVITAMIN PLUS) TABS Take 1 each by mouth daily.     Marland Kitchen. omega-3 fish oil (MAXEPA) 1000 MG CAPS capsule Take 1 capsule by mouth every other day. As Directed    . tamsulosin (FLOMAX) 0.4 MG CAPS capsule Take 1 capsule (0.4 mg total) by mouth daily. 90 capsule 3  . aspirin EC 81 MG tablet Take 81 mg by mouth daily.    . [DISCONTINUED] quinapril (ACCUPRIL) 10 MG tablet Take 1 tablet (10 mg total) by mouth every morning. 30 tablet 5   No current facility-administered medications on file prior to visit.     BP (!) 155/84 (BP Location: Right Arm, Cuff Size: Large)   Pulse 72   Temp 98.4 F (36.9 C) (Oral)   Resp 16   Ht 5\' 6"  (1.676 m)   Wt 208 lb 9.6 oz (94.6 kg)   SpO2 100%   BMI 33.67 kg/m       Objective:   Physical Exam  Constitutional: He is  oriented to person, place, and time. He appears well-developed and well-nourished. No distress.  HENT:  Head: Normocephalic and atraumatic.  Cardiovascular: Normal rate and regular rhythm.  No murmur heard. Pulmonary/Chest: Effort normal and breath sounds normal. No respiratory distress. He has no wheezes. He has no rales.  Musculoskeletal: He exhibits no edema.  Neurological: He is alert and oriented to person, place, and time.  Skin: Skin is warm and dry.  Psychiatric: He has a normal mood and affect. His behavior is normal. Thought content normal.          Assessment & Plan:  Right shoulder pain-we will obtain x-ray of the right shoulder.  I attempted to reach the patient after he left the clinic to let him know that I placed this order.  I spoke to his wife and she will relay the information to him.  Hypertension-blood pressure is uncontrolled.  We discussed the long-term risks of uncontrolled blood pressure including heart failure and kidney failure.  He adamantly difficult declines medication at this time.  Suggested 4751-month follow-up he declines would like to follow back up in 6 months.  States he understands the risks.  Hyperlipidemia-advised patient that her cardiovascular risk calculator he is at increased risk for cardiovascular event and that he would benefit from addition of a statin.  He declines.  BPH-clinically stable, monitor.

## 2017-11-13 NOTE — Patient Instructions (Signed)
Work on low sodium diet, exercise and weight loss.

## 2018-05-16 ENCOUNTER — Encounter: Payer: Self-pay | Admitting: Family

## 2018-05-16 ENCOUNTER — Ambulatory Visit: Payer: BLUE CROSS/BLUE SHIELD | Admitting: Family

## 2018-05-16 VITALS — BP 139/91 | HR 74 | Temp 98.5°F | Resp 18 | Ht 66.0 in | Wt 207.2 lb

## 2018-05-16 DIAGNOSIS — Z23 Encounter for immunization: Secondary | ICD-10-CM

## 2018-05-16 DIAGNOSIS — I10 Essential (primary) hypertension: Secondary | ICD-10-CM

## 2018-05-16 DIAGNOSIS — R0981 Nasal congestion: Secondary | ICD-10-CM | POA: Diagnosis not present

## 2018-05-16 DIAGNOSIS — E785 Hyperlipidemia, unspecified: Secondary | ICD-10-CM

## 2018-05-16 DIAGNOSIS — N400 Enlarged prostate without lower urinary tract symptoms: Secondary | ICD-10-CM | POA: Diagnosis not present

## 2018-05-16 MED ORDER — FLUTICASONE PROPIONATE 50 MCG/ACT NA SUSP: 2.0000 | Freq: Every day | NASAL | 6 refills | Status: DC

## 2018-05-16 NOTE — Patient Instructions (Signed)
Please complete lab work prior to leaving.   

## 2018-05-16 NOTE — Progress Notes (Signed)
Subjective:    Patient ID: Joel Chen, male    DOB: 04/15/1956, 62 y.o.   MRN: [id number]  HPI  Patient is a 62 yr old male who presents today for follow up:  1) HTN-not currently on medication. Working on diet.  BP Readings from Last 3 Encounters:  05/16/18 (!) 139/91  11/13/17 (!) 155/84  08/14/17 140/90   2) Hyperlipidemia- takes fish oil sporadically.  Lab Results  Component Value Date   CHOL 226 (H) 08/14/2017   HDL 51.30 08/14/2017   LDLCALC 145 (H) 08/14/2017   LDLDIRECT 143.0 07/06/2015   TRIG 148.0 08/14/2017   CHOLHDL 4 08/14/2017   3) Nasal congestion- reports that he has been using afrin bid.  He reports that he has troule breathing through his nose. No improvement with cold medicine.  He has been using   4) BPH- using flomax once daily. Denies urinary complaints.  Lab Results  Component Value Date   PSA 2.44 07/08/2016   PSA 1.87 07/06/2015   PSA 2.07 11/13/2013     Review of Systems  See HPI  Past Medical History:  Diagnosis Date  . Chicken pox   . Hyperlipidemia 04/29/2011  . Hypertension   . Mumps   . Personal history of hyperthyroidism    "pt states his hyperthyroidism went away and hasn't had to take meds in 8 yrs."     Social History   Socioeconomic History  . Marital status: Married    Spouse name: Not on file  . Number of children: 2  . Years of education: Not on file  . Highest education level: Not on file  Occupational History  . Occupation: DJ    Comment: self-employed  Social Needs  . Financial resource strain: Not on file  . Food insecurity:    Worry: Not on file    Inability: Not on file  . Transportation needs:    Medical: Not on file    Non-medical: Not on file  Tobacco Use  . Smoking status: Passive Smoke Exposure - Never Smoker  . Smokeless tobacco: Never Used  Substance and Sexual Activity  . Alcohol use: Yes    Alcohol/week: 6.0 standard drinks    Types: 6 Cans of beer per week  . Drug use: No  . Sexual  activity: Yes    Partners: Female  Lifestyle  . Physical activity:    Days per week: Not on file    Minutes per session: Not on file  . Stress: Not on file  Relationships  . Social connections:    Talks on phone: Not on file    Gets together: Not on file    Attends religious service: Not on file    Active member of club or organization: Not on file    Attends meetings of clubs or organizations: Not on file    Relationship status: Not on file  . Intimate partner violence:    Fear of current or ex partner: Not on file    Emotionally abused: Not on file    Physically abused: Not on file    Forced sexual activity: Not on file  Other Topics Concern  . Not on file  Social History Narrative   Regular exercise:  Active at work. Walks 2 x weekly.   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 COLONOSCOPY  09/2009   hemorrhoids  . ROOT CANAL  1998  . WISDOM TOOTH EXTRACTION  1994    Family History  Problem Relation Age of Onset  . Heart disease Mother        Living  . Hypertension Mother   . Cancer Father 40       lung-Deceased  . Dementia Maternal Grandfather   . Heart disease Maternal Aunt   . Hypertension Maternal Aunt   . Other Brother        Hyperglycemia/Pre-diabetes  . Healthy Sister        x2  . Healthy Brother        x1 [#2]  . Allergies Daughter     No Known Allergies  Current Outpatient Medications on File Prior to Visit  Medication Sig Dispense Refill  . aspirin EC 81 MG tablet Take 81 mg by mouth daily.    . Multiple Vitamins-Minerals (MENS MULTIVITAMIN PLUS) TABS Take 1 each by mouth daily.     Marland Kitchen omega-3 fish oil (MAXEPA) 1000 MG CAPS capsule Take 1 capsule by mouth every other day. As Directed    . tamsulosin (FLOMAX) 0.4 MG CAPS capsule Take 1 capsule (0.4 mg total) by mouth daily. 90 capsule 3  .  [DISCONTINUED] quinapril (ACCUPRIL) 10 MG tablet Take 1 tablet (10 mg total) by mouth every morning. 30 tablet 5   No current facility-administered medications on file prior to visit.     BP (!) 139/91 (BP Location: Left Arm, Cuff Size: Normal)   Pulse 74   Temp 98.5 F (36.9 C) (Oral)   Resp 18   Ht 5\' 6"  (1.676 m)   Wt 207 lb 3.2 oz (94 kg)   SpO2 100%   BMI 33.44 kg/m        Objective:   Physical Exam  Constitutional: He is oriented to person, place, and time. He appears well-developed and well-nourished. No distress.  HENT:  Head: Normocephalic and atraumatic.  Nose: No mucosal edema or septal deviation.  No foreign bodies.  Cardiovascular: Normal rate and regular rhythm.  No murmur heard. Pulmonary/Chest: Effort normal and breath sounds normal. No respiratory distress. He has no wheezes. He has no rales.  Musculoskeletal: He exhibits no edema.  Neurological: He is alert and oriented to person, place, and time.  Skin: Skin is warm and dry.  Psychiatric: He has a normal mood and affect. His behavior is normal. Thought content normal.          Assessment & Plan:  HTN- fair bp.  Continue low sodium diet. Will repeat in 3 months.  BPH- stable on flomax, continue same.  Hyperlipidemia- continue diet and fish oil, plan to repeat lipid panel at his upcoming cpx in December.   Nasal congestion- advised pt to d/c afrin and instead use flonase and breath right strips as needed.   Flu shot today.

## 2018-05-16 NOTE — Addendum Note (Signed)
Addended by: Mervin Kung A on: 05/16/2018 10:29 AM   Modules accepted: Orders

## 2018-08-12 ENCOUNTER — Other Ambulatory Visit: Payer: Self-pay | Admitting: Family

## 2018-08-15 ENCOUNTER — Ambulatory Visit (INDEPENDENT_AMBULATORY_CARE_PROVIDER_SITE_OTHER): Payer: BLUE CROSS/BLUE SHIELD | Admitting: Family

## 2018-08-15 ENCOUNTER — Encounter: Payer: Self-pay | Admitting: Family

## 2018-08-15 VITALS — BP 154/77 | HR 82 | Temp 98.4°F | Ht 66.0 in | Wt 208.0 lb

## 2018-08-15 DIAGNOSIS — Z125 Encounter for screening for malignant neoplasm of prostate: Secondary | ICD-10-CM

## 2018-08-15 DIAGNOSIS — Z23 Encounter for immunization: Secondary | ICD-10-CM | POA: Diagnosis not present

## 2018-08-15 DIAGNOSIS — I10 Essential (primary) hypertension: Secondary | ICD-10-CM

## 2018-08-15 DIAGNOSIS — Z0000 Encounter for general adult medical examination without abnormal findings: Secondary | ICD-10-CM

## 2018-08-15 LAB — URINALYSIS, ROUTINE W REFLEX MICROSCOPIC
Bilirubin Urine: NEGATIVE
Hgb urine dipstick: NEGATIVE
Ketones, ur: NEGATIVE
Leukocytes, UA: NEGATIVE
Nitrite: NEGATIVE
PH: 5.5 (ref 5.0–8.0)
RBC / HPF: NONE SEEN (ref 0–?)
Specific Gravity, Urine: 1.02 (ref 1.000–1.030)
Total Protein, Urine: NEGATIVE
Urine Glucose: NEGATIVE
Urobilinogen, UA: 0.2 (ref 0.0–1.0)
WBC, UA: NONE SEEN (ref 0–?)

## 2018-08-15 LAB — CBC WITH DIFFERENTIAL/PLATELET
Basophils Absolute: 0 10*3/uL (ref 0.0–0.1)
Basophils Relative: 0.5 % (ref 0.0–3.0)
Eosinophils Absolute: 0.1 10*3/uL (ref 0.0–0.7)
Eosinophils Relative: 2.7 % (ref 0.0–5.0)
HEMATOCRIT: 47.3 % (ref 39.0–52.0)
HEMOGLOBIN: 15.6 g/dL (ref 13.0–17.0)
LYMPHS PCT: 30.1 % (ref 12.0–46.0)
Lymphs Abs: 1.2 10*3/uL (ref 0.7–4.0)
MCHC: 32.9 g/dL (ref 30.0–36.0)
MCV: 84.4 fl (ref 78.0–100.0)
MONO ABS: 0.3 10*3/uL (ref 0.1–1.0)
Monocytes Relative: 7.4 % (ref 3.0–12.0)
Neutro Abs: 2.4 10*3/uL (ref 1.4–7.7)
Neutrophils Relative %: 59.3 % (ref 43.0–77.0)
Platelets: 266 10*3/uL (ref 150.0–400.0)
RBC: 5.6 Mil/uL (ref 4.22–5.81)
RDW: 14.9 % (ref 11.5–15.5)
WBC: 4.1 10*3/uL (ref 4.0–10.5)

## 2018-08-15 LAB — HEPATIC FUNCTION PANEL
ALT: 31 U/L (ref 0–53)
AST: 21 U/L (ref 0–37)
Albumin: 4.5 g/dL (ref 3.5–5.2)
Alkaline Phosphatase: 75 U/L (ref 39–117)
BILIRUBIN TOTAL: 0.8 mg/dL (ref 0.2–1.2)
Bilirubin, Direct: 0.1 mg/dL (ref 0.0–0.3)
TOTAL PROTEIN: 7 g/dL (ref 6.0–8.3)

## 2018-08-15 LAB — LIPID PANEL
CHOL/HDL RATIO: 4
Cholesterol: 224 mg/dL — ABNORMAL HIGH (ref 0–200)
HDL: 53.2 mg/dL (ref >39.00)
LDL CALC: 146 mg/dL — AB (ref 0–99)
NonHDL: 170.98
TRIGLYCERIDES: 124 mg/dL (ref 0.0–149.0)
VLDL: 24.8 mg/dL (ref 0.0–40.0)

## 2018-08-15 LAB — BASIC METABOLIC PANEL
BUN: 14 mg/dL (ref 6–23)
CO2: 29 mEq/L (ref 19–32)
Calcium: 9.5 mg/dL (ref 8.4–10.5)
Chloride: 104 mEq/L (ref 96–112)
Creatinine, Ser: 1.18 mg/dL (ref 0.40–1.50)
GFR: 80.94 mL/min (ref >60.00)
Glucose, Bld: 106 mg/dL — ABNORMAL HIGH (ref 70–99)
Potassium: 4.5 mEq/L (ref 3.5–5.1)
SODIUM: 143 meq/L (ref 135–145)

## 2018-08-15 LAB — PSA: PSA: 2.91 ng/mL (ref 0.10–4.00)

## 2018-08-15 LAB — TSH: TSH: 1.21 u[IU]/mL (ref 0.35–4.50)

## 2018-08-15 NOTE — Progress Notes (Signed)
Subjective:    Patient ID: Joel Chen, male    DOB: 1956-02-16, 62 y.o.   MRN: [id number]  HPI  Joel Chen is a 62 yr old male who presents today for cpx.   Patient presents today for complete physical.  Immunizations: tetanus is up to date.  Diet:  Reports diet is good overall Exercise: walks dogs every day Colonoscopy: 2011 normal, due 2021 Vision: reports vision is good. Vision up today Dental: up to date  HTN-  BP Readings from Last 3 Encounters:  08/15/18 (!) 154/77  05/16/18 (!) 139/91  11/13/17 (!) 155/84    Wt Readings from Last 3 Encounters:  08/15/18 208 lb (94.3 kg)  05/16/18 207 lb 3.2 oz (94 kg)  11/13/17 208 lb 9.6 oz (94.6 kg)     Review of Systems  Constitutional: Negative for unexpected weight change.  HENT: Positive for rhinorrhea.   Respiratory: Negative for cough.   Cardiovascular: Negative for leg swelling.  Gastrointestinal: Negative for constipation and diarrhea.  Genitourinary: Negative for difficulty urinating and dysuria.  Musculoskeletal: Negative for arthralgias and myalgias.  Skin: Negative for rash.  Neurological: Negative for headaches.  Hematological: Negative for adenopathy.  Psychiatric/Behavioral:       Denies depression/anxiety       Past Medical History:  Diagnosis Date  . Chicken pox   . Hyperlipidemia 04/29/2011  . Hypertension   . Mumps   . Personal history of hyperthyroidism    "pt states his hyperthyroidism went away and hasn't had to take meds in 8 yrs."     Social History   Socioeconomic History  . Marital status: Married    Spouse name: Not on file  . Number of children: 2  . Years of education: Not on file  . Highest education level: Not on file  Occupational History  . Occupation: DJ    Comment: self-employed  Social Needs  . Financial resource strain: Not on file  . Food insecurity:    Worry: Not on file    Inability: Not on file  . Transportation needs:    Medical: Not on file   Non-medical: Not on file  Tobacco Use  . Smoking status: Passive Smoke Exposure - Never Smoker  . Smokeless tobacco: Never Used  Substance and Sexual Activity  . Alcohol use: Yes    Alcohol/week: 6.0 standard drinks    Types: 6 Cans of beer per week  . Drug use: No  . Sexual activity: Yes    Partners: Female  Lifestyle  . Physical activity:    Days per week: Not on file    Minutes per session: Not on file  . Stress: Not on file  Relationships  . Social connections:    Talks on phone: Not on file    Gets together: Not on file    Attends religious service: Not on file    Active member of club or organization: Not on file    Attends meetings of clubs or organizations: Not on file    Relationship status: Not on file  . Intimate partner violence:    Fear of current or ex partner: Not on file    Emotionally abused: Not on file    Physically abused: Not on file    Forced sexual activity: Not on file  Other Topics Concern  . Not on file  Social History Narrative   Regular exercise:  Active at work. Walks 2 x weekly.   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 COLONOSCOPY  09/2009   hemorrhoids  . ROOT CANAL  1998  . WISDOM TOOTH EXTRACTION  1994    Family History  Problem Relation Age of Onset  . Heart disease Mother        Living  . Hypertension Mother   . Cancer Father 102       lung-Deceased  . Dementia Maternal Grandfather   . Heart disease Maternal Aunt   . Hypertension Maternal Aunt   . Other Brother        Hyperglycemia/Pre-diabetes  . Healthy Sister        x2  . Healthy Brother        x1 [#2]  . Allergies Daughter     No Known Allergies  Current Outpatient Medications on File Prior to Visit  Medication Sig Dispense Refill  . tamsulosin (FLOMAX) 0.4 MG CAPS capsule TAKE 1 CAPSULE BY MOUTH EVERY DAY 90  capsule 0  . aspirin EC 81 MG tablet Take 81 mg by mouth daily.    . fluticasone (FLONASE) 50 MCG/ACT nasal spray Place 2 sprays into both nostrils daily. (Patient not taking: Reported on 08/15/2018) 16 g 6  . Multiple Vitamins-Minerals (MENS MULTIVITAMIN PLUS) TABS Take 1 each by mouth daily.     Marland Kitchen omega-3 fish oil (MAXEPA) 1000 MG CAPS capsule Take 1 capsule by mouth every other day. As Directed    . [DISCONTINUED] quinapril (ACCUPRIL) 10 MG tablet Take 1 tablet (10 mg total) by mouth every morning. 30 tablet 5   No current facility-administered medications on file prior to visit.     BP (!) 154/77 (BP Location: Left Arm, Patient Position: Sitting, Cuff Size: Large)   Pulse 82   Temp 98.4 F (36.9 C) (Oral)   Ht 5\' 6"  (1.676 m)   Wt 208 lb (94.3 kg)   SpO2 100%   BMI 33.57 kg/m    Objective:   Physical Exam Physical Exam  Constitutional: He is oriented to person, place, and time. He appears well-developed and well-nourished. No distress.  HENT:  Head: Normocephalic and atraumatic.  Right Ear: Tympanic membrane and ear canal normal.  Left Ear: Tympanic membrane and ear canal normal.  Mouth/Throat: Oropharynx is clear and moist.  Eyes: Pupils are equal, round, and reactive to light. No scleral icterus.  Neck: Normal range of motion. No thyromegaly present.  Cardiovascular: Normal rate and regular rhythm.   No murmur heard. Pulmonary/Chest: Effort normal and breath sounds normal. No respiratory distress. He has no wheezes. He has no rales. He exhibits no tenderness.  Abdominal: Soft. Bowel sounds are normal. He exhibits no distension and no mass. There is no tenderness. There is no rebound and no guarding.  Musculoskeletal: He exhibits no edema.  Lymphadenopathy:    He has no cervical adenopathy.  Neurological: He is alert and oriented to person, place, and time. He has normal patellar reflexes. He exhibits normal muscle tone. Coordination normal.  Skin: Skin is warm and dry.    Psychiatric: He has a normal mood and affect. His behavior is normal. Judgment and thought content normal.           Assessment & Plan:   Preventative care- discussed healthy diet, exercise and weight loss.  Will obtain routine lab  work including PSA.  Colo up to date, tetanus/flu up to date. Declines shingrix today.   HTN-  BP elevated. He took a mucinex nose spray with afrin this AM. Advised pt to d/c and switch to flonase instead. Repeat bp again in a few weeks and if elevated plan to start bp med.       Assessment & Plan:

## 2018-08-15 NOTE — Patient Instructions (Signed)
Please complete lab work prior to leaving. Stop mucinex nasal spray and start flonase.

## 2018-08-16 ENCOUNTER — Encounter: Payer: Self-pay | Admitting: Family

## 2018-08-31 ENCOUNTER — Ambulatory Visit: Payer: BLUE CROSS/BLUE SHIELD | Admitting: Family

## 2018-11-11 ENCOUNTER — Other Ambulatory Visit: Payer: Self-pay | Admitting: Family

## 2019-02-03 ENCOUNTER — Other Ambulatory Visit: Payer: Self-pay | Admitting: Family

## 2019-02-27 ENCOUNTER — Other Ambulatory Visit: Payer: Self-pay | Admitting: Family

## 2020-08-14 ENCOUNTER — Ambulatory Visit: Payer: Self-pay | Attending: Internal Medicine

## 2020-08-14 ENCOUNTER — Other Ambulatory Visit (HOSPITAL_BASED_OUTPATIENT_CLINIC_OR_DEPARTMENT_OTHER): Payer: Self-pay | Admitting: Internal Medicine

## 2020-08-14 DIAGNOSIS — Z23 Encounter for immunization: Secondary | ICD-10-CM

## 2020-08-14 NOTE — Progress Notes (Signed)
° °  Covid-19 Vaccination Clinic  Name:  Joel Chen    MRN: [id number] DOB: 24-Jul-1956  08/14/2020  [name] was observed post Covid-19 immunization for 15 minutes without incident. He was provided with Vaccine Information Sheet and instruction to access the V-Safe system.   [name] was instructed to call 911 with any severe reactions post vaccine:  Difficulty breathing   Swelling of face and throat   A fast heartbeat   A bad rash all over body   Dizziness and weakness   Immunizations Administered    Name Date Dose VIS Date Route   Pfizer COVID-19 Vaccine 08/14/2020 11:06 AM 0.3 mL 07/01/2020 Intramuscular   Manufacturer: [employer], [employer]   Lot: [id number]   NDC: [address]-3

## 2020-08-18 MED FILL — PFIZER-BIONTECH COVID-19 VA: 30 | 1 days supply | Qty: 0 | Fill #0

## 2020-09-23 ENCOUNTER — Other Ambulatory Visit: Payer: Self-pay | Admitting: Family

## 2020-10-15 ENCOUNTER — Other Ambulatory Visit: Payer: Self-pay

## 2020-10-16 ENCOUNTER — Telehealth: Payer: Self-pay | Admitting: Family

## 2020-10-16 ENCOUNTER — Ambulatory Visit (INDEPENDENT_AMBULATORY_CARE_PROVIDER_SITE_OTHER): Payer: 59 | Admitting: Family

## 2020-10-16 ENCOUNTER — Encounter: Payer: Self-pay | Admitting: Family

## 2020-10-16 ENCOUNTER — Other Ambulatory Visit: Payer: Self-pay

## 2020-10-16 VITALS — BP 150/79 | HR 93 | Temp 98.9°F | Resp 16 | Ht 67.0 in | Wt 206.0 lb

## 2020-10-16 DIAGNOSIS — Z8639 Personal history of other endocrine, nutritional and metabolic disease: Secondary | ICD-10-CM | POA: Diagnosis not present

## 2020-10-16 DIAGNOSIS — Z0000 Encounter for general adult medical examination without abnormal findings: Secondary | ICD-10-CM

## 2020-10-16 DIAGNOSIS — Z23 Encounter for immunization: Secondary | ICD-10-CM

## 2020-10-16 DIAGNOSIS — E785 Hyperlipidemia, unspecified: Secondary | ICD-10-CM

## 2020-10-16 DIAGNOSIS — I10 Essential (primary) hypertension: Secondary | ICD-10-CM | POA: Diagnosis not present

## 2020-10-16 DIAGNOSIS — J309 Allergic rhinitis, unspecified: Secondary | ICD-10-CM

## 2020-10-16 DIAGNOSIS — Z125 Encounter for screening for malignant neoplasm of prostate: Secondary | ICD-10-CM

## 2020-10-16 LAB — COMPREHENSIVE METABOLIC PANEL
ALT: 26 U/L (ref 0–53)
AST: 17 U/L (ref 0–37)
Albumin: 4.3 g/dL (ref 3.5–5.2)
Alkaline Phosphatase: 78 U/L (ref 39–117)
BUN: 19 mg/dL (ref 6–23)
CO2: 32 mEq/L (ref 19–32)
Calcium: 9.4 mg/dL (ref 8.4–10.5)
Chloride: 101 mEq/L (ref 96–112)
Creatinine, Ser: 1.28 mg/dL (ref 0.40–1.50)
GFR: 60.07 mL/min (ref >60.00)
Glucose, Bld: 84 mg/dL (ref 70–99)
Potassium: 4.4 mEq/L (ref 3.5–5.1)
Sodium: 139 mEq/L (ref 135–145)
Total Bilirubin: 0.9 mg/dL (ref 0.2–1.2)
Total Protein: 6.9 g/dL (ref 6.0–8.3)

## 2020-10-16 LAB — LIPID PANEL
Cholesterol: 207 mg/dL — ABNORMAL HIGH (ref 0–200)
HDL: 54.8 mg/dL (ref >39.00)
NonHDL: 152.26
Total CHOL/HDL Ratio: 4
Triglycerides: 252 mg/dL — ABNORMAL HIGH (ref 0.0–149.0)
VLDL: 50.4 mg/dL — ABNORMAL HIGH (ref 0.0–40.0)

## 2020-10-16 LAB — TSH: TSH: 1.69 u[IU]/mL (ref 0.35–4.50)

## 2020-10-16 LAB — PSA: PSA: 3.1 ng/mL (ref 0.10–4.00)

## 2020-10-16 LAB — LDL CHOLESTEROL, DIRECT: Direct LDL: 124 mg/dL

## 2020-10-16 MED ORDER — FLUTICASONE PROPIONATE 50 MCG/ACT NA SUSP: 2.0000 | Freq: Every day | NASAL | 6 refills | Status: DC

## 2020-10-16 MED ORDER — CETIRIZINE HCL 10 MG PO TABS: 10.0000 mg | ORAL_TABLET | Freq: Every day | ORAL | 11 refills | Status: DC

## 2020-10-16 NOTE — Telephone Encounter (Signed)
Please call [facility] to request copy of colo.

## 2020-10-16 NOTE — Patient Instructions (Addendum)
Add zyrtec once daily and flonase 2 sprays each nostril once daily. Stop afrin.  Work on healthy low sodium diet, exercise, weight loss.

## 2020-10-16 NOTE — Progress Notes (Signed)
Subjective:    Patient ID: Joel Chen, male    DOB: 14-Sep-1955, 65 y.o.   MRN: [id number]  HPI  Patient presents today for complete physical.  Immunizations: Flu shot today. Shingrix due Diet: fair Wt Readings from Last 3 Encounters:  10/16/20 206 lb (93.4 kg)  08/15/18 208 lb (94.3 kg)  05/16/18 207 lb 3.2 oz (94 kg)  Exercise: walks dogs daily Colonoscopy: completed at [facility]  Hx hypothyroid- not currently on synthroid.  Lab Results  Component Value Date   TSH 1.21 08/15/2018   Snoring- works on projects at home and stays up late. Usually doesn't go to sleep until 3AM but will get up at 6-7 AM. +snoring.  He has started using nose strips. Sinuses have been "swollen."  Some post nasal drip. He does use afrin most days. Also using navage. Wife notes that he falls asleep after eating.   Lab Results  Component Value Date   PSA 2.91 08/15/2018   PSA 2.44 07/08/2016   PSA 1.87 07/06/2015   Reports vision and dental are up to date.  BP Readings from Last 3 Encounters:  10/16/20 (!) 150/79  08/15/18 (!) 154/77  05/16/18 (!) 139/91   Review of Systems  Constitutional: Negative for unexpected weight change.  HENT: Negative for hearing loss and rhinorrhea.   Eyes: Negative for visual disturbance.  Respiratory: Negative for cough and shortness of breath.   Cardiovascular: Negative for chest pain.  Gastrointestinal: Negative for diarrhea, nausea and vomiting.  Genitourinary: Negative for difficulty urinating.  Musculoskeletal: Positive for arthralgias (occasional knee pain). Negative for myalgias.  Skin: Negative for rash.  Neurological: Negative for headaches.  Hematological: Negative for adenopathy.  Psychiatric/Behavioral:       Denies depression/anxiety   Past Medical History:  Diagnosis Date  . Chicken pox   . Hyperlipidemia 04/29/2011  . Hypertension   . Mumps   . Personal history of hyperthyroidism    "pt states his hyperthyroidism went away and hasn't had  to take meds in 8 yrs."     Social History   Socioeconomic History  . Marital status: Married    Spouse name: Not on file  . Number of children: 2  . Years of education: Not on file  . Highest education level: Not on file  Occupational History  . Occupation: DJ    Comment: self-employed  Tobacco Use  . Smoking status: Passive Smoke Exposure - Never Smoker  . Smokeless tobacco: Never Used  Substance and Sexual Activity  . Alcohol use: Yes    Alcohol/week: 6.0 standard drinks    Types: 6 Cans of beer per week  . Drug use: No  . Sexual activity: Yes    Partners: Female  Other Topics Concern  . Not on file  Social History Narrative   Regular exercise:  Active at work. Walks 2 x weekly.   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 COLONOSCOPY  09/2009   hemorrhoids  .  ROOT CANAL  1998  . WISDOM TOOTH EXTRACTION  1994    Family History  Problem Relation Age of Onset  . Heart disease Mother        Living  . Hypertension Mother   . Cancer Father 4       lung-Deceased  . Dementia Maternal Grandfather   . Heart disease Maternal Aunt   . Hypertension Maternal Aunt   . Other Brother        Hyperglycemia/Pre-diabetes  . Healthy Sister        x2  . Healthy Brother        x1 [#2]  . Allergies Daughter     No Known Allergies  Current Outpatient Medications on File Prior to Visit  Medication Sig Dispense Refill  . tamsulosin (FLOMAX) 0.4 MG CAPS capsule TAKE 1 CAPSULE(0.4 MG) BY MOUTH DAILY 90 capsule 0  . omega-3 fish oil (MAXEPA) 1000 MG CAPS capsule  Take 1 capsule by mouth every other day. As Directed (Patient not taking: Reported on 10/16/2020)    . [DISCONTINUED] quinapril (ACCUPRIL) 10 MG tablet Take 1 tablet (10 mg total) by mouth every morning. 30 tablet 5   No current facility-administered medications on file prior to visit.    BP (!) 150/79 (BP Location: Right Arm, Patient Position: Sitting, Cuff Size: Large)   Pulse 93   Temp 98.9 F (37.2 C) (Oral)   Resp 16   Ht 5\' 7"  (1.702 m)   Wt 206 lb (93.4 kg)   SpO2 100%   BMI 32.26 kg/m       Objective:   Physical Exam  Physical Exam  Constitutional: He is oriented to person, place, and time. He appears well-developed and well-nourished. No distress.  HENT:  Head: Normocephalic and atraumatic.  Right Ear: Tympanic membrane and ear canal normal.  Left Ear: Tympanic membrane and ear canal normal.  Mouth/Throat: not examined-pt wearing mask Eyes: Pupils are equal, round, and reactive to light. No scleral icterus.  Neck: Normal range of motion. No thyromegaly present.  Cardiovascular: Normal rate and regular rhythm.   No murmur heard. Pulmonary/Chest: Effort normal and breath sounds normal. No respiratory distress. He has no wheezes. He has no rales. He exhibits no tenderness.  Abdominal: Soft. Bowel sounds are normal. He exhibits no distension and no mass. There is no tenderness. There is no rebound and no guarding.  Musculoskeletal: He exhibits no edema.  Lymphadenopathy:    He has no cervical adenopathy.  Neurological: He is alert and oriented to person, place, and time. He has normal patellar reflexes. He exhibits normal muscle tone. Coordination normal.  Skin: Skin is warm and dry.  Psychiatric: He has a normal mood and affect. His behavior is normal. Judgment and thought content normal.           Assessment & Plan:   Preventative care- Discussed diet/exercise/weight loss.  Shingrix #1 and flu shot both given today. We discussed importance of good sleep hygiene.   If he still is having issues with falling asleep during the day after he adjusts his sleep schedule, then we should consider a sleep study.  Allergic rhinitiis-  Recommended that pt add zyrtec once daily and flonase 2 sprays each nostril once daily. Stop afrin.   HTN-  We discussed elevated blood pressure and restarting medication. He wishes to try to work on his diet/lifestyle. Will bring him back in 6 weeks, and if not improved at that time, plan to start medication.  This visit  occurred during the SARS-CoV-2 public health emergency.  Safety protocols were in place, including screening questions prior to the visit, additional usage of staff PPE, and extensive cleaning of exam room while observing appropriate contact time as indicated for disinfecting solutions.         Assessment & Plan:

## 2020-10-16 NOTE — Telephone Encounter (Signed)
Request faxed

## 2020-10-18 ENCOUNTER — Encounter: Payer: Self-pay | Admitting: Family

## 2020-10-19 NOTE — Progress Notes (Signed)
Mailed out to pt 

## 2020-11-27 ENCOUNTER — Ambulatory Visit (INDEPENDENT_AMBULATORY_CARE_PROVIDER_SITE_OTHER): Payer: 59 | Admitting: Family

## 2020-11-27 ENCOUNTER — Encounter: Payer: Self-pay | Admitting: Family

## 2020-11-27 ENCOUNTER — Other Ambulatory Visit: Payer: Self-pay

## 2020-11-27 VITALS — BP 150/78 | HR 76 | Temp 98.6°F | Resp 16 | Ht 67.0 in | Wt 209.0 lb

## 2020-11-27 DIAGNOSIS — I10 Essential (primary) hypertension: Secondary | ICD-10-CM

## 2020-11-27 DIAGNOSIS — K649 Unspecified hemorrhoids: Secondary | ICD-10-CM | POA: Diagnosis not present

## 2020-11-27 MED ORDER — AMLODIPINE BESYLATE 5 MG PO TABS: 5.0000 mg | ORAL_TABLET | Freq: Every day | ORAL | 3 refills | Status: DC

## 2020-11-27 NOTE — Progress Notes (Signed)
Subjective:    Patient ID: Joel Chen, male    DOB: 1955-11-01, 65 y.o.   MRN: [id number]  HPI  Patient is a 65 yr old male who presents today for follow up of his hypertension.  BP Readings from Last 3 Encounters:  11/27/20 (!) 150/78  10/16/20 (!) 150/79  08/15/18 (!) 154/77   He reports that he had some bleeding hemorrhoids. Has since resolved.   Review of Systems See HPI  Past Medical History:  Diagnosis Date  . Chicken pox   . Hyperlipidemia 04/29/2011  . Hypertension   . Mumps   . Personal history of hyperthyroidism    "pt states his hyperthyroidism went away and hasn't had to take meds in 8 yrs."     Social History   Socioeconomic History  . Marital status: Married    Spouse name: Not on file  . Number of children: 2  . Years of education: Not on file  . Highest education level: Not on file  Occupational History  . Occupation: DJ    Comment: self-employed  Tobacco Use  . Smoking status: Passive Smoke Exposure - Never Smoker  . Smokeless tobacco: Never Used  Substance and Sexual Activity  . Alcohol use: Yes    Alcohol/week: 6.0 standard drinks    Types: 6 Cans of beer per week    Comment: "couple" a week  . Drug use: No  . Sexual activity: Yes    Partners: Female  Other Topics Concern  . Not on file  Social History Narrative   Regular exercise:  Active at work. Walks 2 x weekly.   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 COLONOSCOPY  09/2009   hemorrhoids  . ROOT CANAL  1998  . WISDOM TOOTH EXTRACTION  1994    Family  History  Problem Relation Age of Onset  . Heart disease Mother        Living  . Hypertension Mother   . Cancer Father 79       lung-Deceased  . Dementia Maternal Grandfather   . Heart disease Maternal Aunt   . Hypertension Maternal Aunt   . Other Brother        Hyperglycemia/Pre-diabetes  . Healthy Sister        x2  . Healthy Brother        x1 [#2]  . Allergies Daughter     No Known Allergies  Current Outpatient Medications on File Prior to Visit  Medication Sig Dispense Refill  . cetirizine (ZYRTEC) 10 MG tablet Take 1 tablet (10 mg total) by mouth daily. 30 tablet 11  . fluticasone (FLONASE) 50 MCG/ACT nasal spray Place 2 sprays into both nostrils daily. 16 g 6  . omega-3 fish oil (MAXEPA) 1000 MG CAPS capsule Take 1 capsule by mouth every other day. As Directed    . tamsulosin (FLOMAX) 0.4 MG CAPS capsule TAKE 1 CAPSULE(0.4 MG) BY MOUTH DAILY 90 capsule 0  . [  DISCONTINUED] quinapril (ACCUPRIL) 10 MG tablet Take 1 tablet (10 mg total) by mouth every morning. 30 tablet 5   No current facility-administered medications on file prior to visit.    BP (!) 150/78 (BP Location: Right Arm, Patient Position: Sitting, Cuff Size: Large)   Pulse 76   Temp 98.6 F (37 C) (Oral)   Resp 16   Ht 5\' 7"  (1.702 m)   Wt 209 lb (94.8 kg)   SpO2 100%   BMI 32.73 kg/m       Objective:   Physical Exam Constitutional:      General: He is not in acute distress.    Appearance: He is well-developed.  HENT:     Head: Normocephalic and atraumatic.  Cardiovascular:     Rate and Rhythm: Normal rate and regular rhythm.     Heart sounds: No murmur heard.   Pulmonary:     Effort: Pulmonary effort is normal. No respiratory distress.     Breath sounds: Normal breath sounds. No wheezing or rales.  Skin:    General: Skin is warm and dry.  Neurological:     Mental Status: He is alert and oriented to person, place, and time.  Psychiatric:        Behavior: Behavior normal.        Thought  Content: Thought content normal.           Assessment & Plan:  HTN- bp is still high. He has been working on lifestyle changes.  Advised pt to begin amlodipine 5mg  once daily and he is agreeable.  Bleeding hemorrhoids- will check with his GI to see if he does hemorrhoidal banding and then plan to place formal referral.  This visit occurred during the SARS-CoV-2 public health emergency.  Safety protocols were in place, including screening questions prior to the visit, additional usage of staff PPE, and extensive cleaning of exam room while observing appropriate contact time as indicated for disinfecting solutions.

## 2020-11-27 NOTE — Patient Instructions (Signed)
Start amlodipine 5 mg once daily for blood pressure.

## 2020-12-02 ENCOUNTER — Telehealth: Payer: Self-pay | Admitting: Family

## 2020-12-02 DIAGNOSIS — K649 Unspecified hemorrhoids: Secondary | ICD-10-CM

## 2020-12-02 NOTE — Telephone Encounter (Signed)
They do not do them there but they stated that they send them to [facility]

## 2020-12-02 NOTE — Telephone Encounter (Signed)
Could you please call pt's GI office [facility], Dr. Charlott Rakes and ask if Dr. Bosie Clos or any of his partners do hemorrhoid banding procedures?

## 2020-12-03 NOTE — Telephone Encounter (Signed)
Lvm for patient to call back

## 2020-12-03 NOTE — Telephone Encounter (Signed)
Please advise pt that I have placed a referral to general surgery to evaluate his hemorrhoids.

## 2020-12-03 NOTE — Telephone Encounter (Signed)
Patient called back and was advised of referral

## 2020-12-29 ENCOUNTER — Other Ambulatory Visit: Payer: Self-pay | Admitting: Family

## 2020-12-29 ENCOUNTER — Telehealth: Payer: Self-pay | Admitting: Family

## 2020-12-29 NOTE — Telephone Encounter (Signed)
Medication:amLODipine (NORVASC) 5 MG tablet [[id number]]    Has the patient contacted their pharmacy? No. (If no, request that the patient contact the pharmacy for the refill.) (If yes, when and what did the pharmacy advise?)  Preferred Pharmacy (with phone number or street name): [id number] - [place], [place] - [address]  [address], [place] [address]  Phone:  [phone] Fax:  [phone]   Agent: Please be advised that RX refills may take up to 3 business days. We ask that you follow-up with your pharmacy.

## 2020-12-29 NOTE — Telephone Encounter (Signed)
Patient advised to contact pharmacy to get his refills.

## 2020-12-30 ENCOUNTER — Ambulatory Visit (INDEPENDENT_AMBULATORY_CARE_PROVIDER_SITE_OTHER): Payer: 59 | Admitting: Family

## 2020-12-30 ENCOUNTER — Other Ambulatory Visit: Payer: Self-pay

## 2020-12-30 DIAGNOSIS — I10 Essential (primary) hypertension: Secondary | ICD-10-CM | POA: Diagnosis not present

## 2020-12-30 NOTE — Assessment & Plan Note (Signed)
BP is stable/improved on amlodipine 5mg  once daily. Will continue current dosing and plan to repeat bp in 3 months.  BP Readings from Last 3 Encounters:  12/30/20 130/62  11/27/20 (!) 150/78  10/16/20 (!) 150/79

## 2020-12-30 NOTE — Progress Notes (Signed)
Subjective:   By signing my name below, I, Shehryar Baig, attest that this documentation has been prepared under the direction and in the presence of Sandford Craze. 12/30/2020    Patient ID: Joel Chen, male    DOB: 06-Mar-1956, 65 y.o.   MRN: [id number]  No chief complaint on file.   HPI Patient is in today for a office visit.  Hypertension- He is taking 5 mg amlodipine daily PO to manage is manage his hypertension. He reports he is doing well on this medication. His blood pressure measured in office today was 130/62.  BP Readings from Last 3 Encounters:  12/30/20 (!) 147/86  11/27/20 (!) 150/78  10/16/20 (!) 150/79     Past Medical History:  Diagnosis Date  . Chicken pox   . Hyperlipidemia 04/29/2011  . Hypertension   . Mumps   . Personal history of hyperthyroidism    "pt states his hyperthyroidism went away and hasn't had to take meds in 8 yrs."    Past Surgical History:  Procedure Laterality Date  . COLONOSCOPY  09/2009   hemorrhoids  . ROOT CANAL  1998  . WISDOM TOOTH EXTRACTION  1994    Family History  Problem Relation Age of Onset  . Heart disease Mother        Living  . Hypertension Mother   . Cancer Father 1       lung-Deceased  . Dementia Maternal Grandfather   . Heart disease Maternal Aunt   . Hypertension Maternal Aunt   . Other Brother        Hyperglycemia/Pre-diabetes  . Healthy Sister        x2  . Healthy Brother        x1 [#2]  . Allergies Daughter     Social History   Socioeconomic History  . Marital status: Married    Spouse name: Not on file  . Number of children: 2  . Years of education: Not on file  . Highest education level: Not on file  Occupational History  . Occupation: DJ    Comment: self-employed  Tobacco Use  . Smoking status: Passive Smoke Exposure - Never Smoker  . Smokeless tobacco: Never Used  Substance and Sexual Activity  . Alcohol use: Yes    Alcohol/week: 6.0 standard drinks    Types: 6 Cans of  beer per week    Comment: "couple" a week  . Drug use: No  . Sexual activity: Yes    Partners: Female  Other Topics Concern  . Not on file  Social History Narrative   Regular exercise:  Active at work. Walks 2 x weekly.   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 amLODipine (NORVASC) 5 MG tablet Take 1 tablet (5 mg total) by mouth daily. 30 tablet 3  . cetirizine (ZYRTEC) 10 MG tablet Take 1 tablet (10 mg total) by mouth daily. 30 tablet 11  . COVID-19 mRNA vaccine,  Pfizer, 30 MCG/0.3ML injection INJECT AS DIRECTED .3 mL 0  . fluticasone (FLONASE) 50 MCG/ACT nasal spray Place 2 sprays into both nostrils daily. 16 g 6  . omega-3 fish oil (MAXEPA) 1000 MG CAPS capsule Take 1 capsule by mouth every other day. As Directed    . tamsulosin (FLOMAX) 0.4 MG CAPS capsule Take 1 capsule (0.4 mg total) by mouth daily. 90 capsule 3   No facility-administered medications prior to visit.    No Known Allergies  ROS     Objective:    Physical Exam Cardiovascular:     Rate and Rhythm: Normal rate and regular rhythm.     Pulses: Normal pulses.     Heart sounds: Normal heart sounds.     There were no vitals taken for this visit. Wt Readings from Last 3 Encounters:  11/27/20 209 lb (94.8 kg)  10/16/20 206 lb (93.4 kg)  08/15/18 208 lb (94.3 kg)    Diabetic Foot Exam - Simple   No data filed    Lab Results  Component Value Date   WBC 4.1 08/15/2018   HGB 15.6 08/15/2018   HCT 47.3 08/15/2018   PLT 266.0 08/15/2018   GLUCOSE 84 10/16/2020    CHOL 207 (H) 10/16/2020   TRIG 252.0 (H) 10/16/2020   HDL 54.80 10/16/2020   LDLDIRECT 124.0 10/16/2020   LDLCALC 146 (H) 08/15/2018   ALT 26 10/16/2020   AST 17 10/16/2020   NA 139 10/16/2020   K 4.4 10/16/2020   CL 101 10/16/2020   CREATININE 1.28 10/16/2020   BUN 19 10/16/2020   CO2 32 10/16/2020   TSH 1.69 10/16/2020   PSA 3.10 10/16/2020   HGBA1C 5.4 07/08/2016    Lab Results  Component Value Date   TSH 1.69 10/16/2020   Lab Results  Component Value Date   WBC 4.1 08/15/2018   HGB 15.6 08/15/2018   HCT 47.3 08/15/2018   MCV 84.4 08/15/2018   PLT 266.0 08/15/2018   Lab Results  Component Value Date   NA 139 10/16/2020   K 4.4 10/16/2020   CO2 32 10/16/2020   GLUCOSE 84 10/16/2020   BUN 19 10/16/2020   CREATININE 1.28 10/16/2020   BILITOT 0.9 10/16/2020   ALKPHOS 78 10/16/2020   AST 17 10/16/2020   ALT 26 10/16/2020   PROT 6.9 10/16/2020   ALBUMIN 4.3 10/16/2020   CALCIUM 9.4 10/16/2020   GFR 60.07 10/16/2020   Lab Results  Component Value Date   CHOL 207 (H) 10/16/2020   Lab Results  Component Value Date   HDL 54.80 10/16/2020   Lab Results  Component Value Date   LDLCALC 146 (H) 08/15/2018   Lab Results  Component Value Date   TRIG 252.0 (H) 10/16/2020   Lab Results  Component Value Date   CHOLHDL 4 10/16/2020   Lab Results  Component Value Date   HGBA1C 5.4 07/08/2016       Assessment & Plan:   Problem List Items Addressed This Visit   None      No orders of the defined types were placed in this encounter.   I, Shehryar Tyson Alias, personally preformed the services described in this documentation.  All medical record entries made by the scribe were at my direction and in my presence.  I have reviewed the chart and discharge instructions (if applicable) and agree that the record reflects my personal performance and is accurate and complete. 12/30/2020   I,Shehryar Baig,acting as a Neurosurgeon for [employer]  Peggyann Juba, NP.,have  documented all relevant documentation on the behalf of Lemont Fillers, NP,as directed by  Lemont Fillers, NP while in the presence of Lemont Fillers, NP.    Shehryar [employer]

## 2021-03-24 ENCOUNTER — Other Ambulatory Visit: Payer: Self-pay | Admitting: Family

## 2021-03-31 ENCOUNTER — Ambulatory Visit (INDEPENDENT_AMBULATORY_CARE_PROVIDER_SITE_OTHER): Payer: 59 | Admitting: Family

## 2021-03-31 ENCOUNTER — Other Ambulatory Visit: Payer: Self-pay

## 2021-03-31 VITALS — BP 142/65 | HR 68 | Temp 98.5°F | Resp 18 | Ht 67.0 in | Wt 203.0 lb

## 2021-03-31 DIAGNOSIS — N401 Enlarged prostate with lower urinary tract symptoms: Secondary | ICD-10-CM | POA: Diagnosis not present

## 2021-03-31 DIAGNOSIS — Z8639 Personal history of other endocrine, nutritional and metabolic disease: Secondary | ICD-10-CM

## 2021-03-31 DIAGNOSIS — Z23 Encounter for immunization: Secondary | ICD-10-CM | POA: Diagnosis not present

## 2021-03-31 DIAGNOSIS — I10 Essential (primary) hypertension: Secondary | ICD-10-CM

## 2021-03-31 NOTE — Assessment & Plan Note (Addendum)
BP Readings from Last 3 Encounters:  03/31/21 (!) 146/60  12/30/20 130/62  11/27/20 (!) 150/78   Fair BP today.  Continue amlodipine 5mg  once daily. He will check bp once daily for a few days at home and then send me his readings via mychart.

## 2021-03-31 NOTE — Assessment & Plan Note (Signed)
Lab Results  Component Value Date   TSH 1.69 10/16/2020   Wt Readings from Last 3 Encounters:  03/31/21 203 lb (92.1 kg)  12/30/20 209 lb (94.8 kg)  11/27/20 209 lb (94.8 kg)   Clinically stable. Monitor.

## 2021-03-31 NOTE — Progress Notes (Signed)
Subjective:   By signing my name below, I, Joel Chen, attest that this documentation has been prepared under the direction and in the presence of Sandford Craze NP. 03/31/2021    Patient ID: Joel Chen, male    DOB: Mar 04, 1956, 65 y.o.   MRN: [id number]  Chief Complaint  Patient presents with   Follow-up    HPI Patient is in today for an office visit.  Hypertension:  He is managing his symptoms with 5 mg Amlodipine PO daily. He keeps a blood pressure log.  BP Readings from Last 3 Encounters:  03/31/21 (!) 142/65  12/30/20 130/62  11/27/20 (!) 150/78    Allergy: He has been managing his symptoms with 10 mg Zyrtec PO every other day and reports doing well with it. Prostate: He is managing his symptoms with 0.4 mg Flomax PO daily. He reports that he is doing well at this time. He reports that he does not get up to use the bathroom at night. Congestion: He recently started taking OTC congestion medication.  Immunizations: He is getting the second shingles vaccine today. He has 3 Covid-19 vaccines at this time and is planning to get a second booster vaccine next week. He is not interested in HIV or Hepatitis C screening at this time. Exercise: He reports that he has been very active this summer.   Health Maintenance Due  Topic Date Due   COVID-19 Vaccine (4 - Booster for [employer] series) 12/13/2020    Past Medical History:  Diagnosis Date   Chicken pox    Hyperlipidemia 04/29/2011   Hypertension    Mumps    Personal history of hyperthyroidism    "pt states his hyperthyroidism went away and hasn't had to take meds in 8 yrs."    Past Surgical History:  Procedure Laterality Date   COLONOSCOPY  09/2009   hemorrhoids   ROOT CANAL  1998   WISDOM TOOTH EXTRACTION  1994    Family History  Problem Relation Age of Onset   Heart disease Mother        Living   Hypertension Mother    Cancer Father 16       lung-Deceased   Dementia Maternal Grandfather    Heart  disease Maternal Aunt    Hypertension Maternal Aunt    Other Brother        Hyperglycemia/Pre-diabetes   Healthy Sister        x2   Healthy Brother        x1 [#2]   Allergies Daughter     Social History   Socioeconomic History   Marital status: Married    Spouse name: Not on file   Number of children: 2   Years of education: Not on file   Highest education level: Not on file  Occupational History   Occupation: DJ    Comment: self-employed  Tobacco Use   Smoking status: Passive Smoke Exposure - Never Smoker   Smokeless tobacco: Never  Substance and Sexual Activity   Alcohol use: Yes    Alcohol/week: 6.0 standard drinks    Types: 6 Cans of beer per week    Comment: "couple" a week   Drug use: No   Sexual activity: Yes    Partners: Female  Other Topics Concern   Not on file  Social History Narrative   Regular exercise:  Active at work. Walks 2 x weekly.   Caffeine use: 1 cup coffee daily   Building control surveyor at [employer]  Lost a daughter to MVA   Has another daughter               Social Determinants of Corporate investment banker Strain: Not on file  Food Insecurity: Not on file  Transportation Needs: Not on file  Physical Activity: Not on file  Stress: Not on file  Social Connections: Not on file  Intimate Partner Violence: Not on file    Outpatient Medications Prior to Visit  Medication Sig Dispense Refill   amLODipine (NORVASC) 5 MG tablet TAKE 1 TABLET(5 MG) BY MOUTH DAILY 30 tablet 3   cetirizine (ZYRTEC) 10 MG tablet Take 1 tablet (10 mg total) by mouth daily. 30 tablet 11   fluticasone (FLONASE) 50 MCG/ACT nasal spray Place 2 sprays into both nostrils daily. 16 g 6   omega-3 fish oil (MAXEPA) 1000 MG CAPS capsule Take 1 capsule by mouth every other day. As Directed     tamsulosin (FLOMAX) 0.4 MG CAPS capsule Take 1 capsule (0.4 mg total) by mouth daily. 90 capsule 3   COVID-19 mRNA vaccine, Pfizer, 30 MCG/0.3ML injection INJECT AS DIRECTED .3  mL 0   No facility-administered medications prior to visit.    No Known Allergies  ROS See HPI    Objective:    Physical Exam Constitutional:      General: He is not in acute distress.    Appearance: Normal appearance. He is not ill-appearing.  HENT:     Head: Normocephalic and atraumatic.     Right Ear: External ear normal.     Left Ear: External ear normal.  Eyes:     Extraocular Movements: Extraocular movements intact.     Pupils: Pupils are equal, round, and reactive to light.  Cardiovascular:     Rate and Rhythm: Normal rate and regular rhythm.     Pulses: Normal pulses.     Heart sounds: Normal heart sounds. No murmur heard.   No gallop.     Comments: Blood pressure taken during the physical is 142/65 Pulmonary:     Effort: Pulmonary effort is normal. No respiratory distress.     Breath sounds: Normal breath sounds. No wheezing, rhonchi or rales.  Skin:    General: Skin is warm and dry.  Neurological:     Mental Status: He is alert and oriented to person, place, and time.  Psychiatric:        Behavior: Behavior normal.        Judgment: Judgment normal.    BP (!) 142/65   Pulse 68   Temp 98.5 F (36.9 C)   Resp 18   Ht 5\' 7"  (1.702 m)   Wt 203 lb (92.1 kg)   SpO2 98%   BMI 31.79 kg/m  Wt Readings from Last 3 Encounters:  03/31/21 203 lb (92.1 kg)  12/30/20 209 lb (94.8 kg)  11/27/20 209 lb (94.8 kg)       Assessment & Plan:   Problem List Items Addressed This Visit       Unprioritized   Personal history of hyperthyroidism    Lab Results  Component Value Date   TSH 1.69 10/16/2020   Wt Readings from Last 3 Encounters:  03/31/21 203 lb (92.1 kg)  12/30/20 209 lb (94.8 kg)  11/27/20 209 lb (94.8 kg)  Clinically stable. Monitor.       Hypertension - Primary    BP Readings from Last 3 Encounters:  03/31/21 (!) 146/60  12/30/20 130/62  11/27/20 (!) 150/78  Fair BP today.  Continue amlodipine 5mg  once daily.      Benign prostatic  hyperplasia with lower urinary tract symptoms    Stable on flomax 0.4mg  daily- continue same.       Other Visit Diagnoses     Need for shingles vaccine       Relevant Orders   Varicella-zoster vaccine IM (Shingrix) (Completed)         No orders of the defined types were placed in this encounter.   I, NP., personally preformed the services described in this documentation.  All medical record entries made by the scribe were at my direction and in my presence.  I have reviewed the chart and discharge instructions (if applicable) and agree that the record reflects my personal performance and is accurate and complete. 03/31/2021   I,Joel Chen,acting as a 04/02/2021 for Neurosurgeon, NP.,have documented all relevant documentation on the behalf of Lemont Fillers, NP,as directed by  Lemont Fillers, NP while in the presence of Lemont Fillers, NP.    Lemont Fillers, NP

## 2021-03-31 NOTE — Assessment & Plan Note (Signed)
Stable on flomax 0.4mg  daily- continue same.

## 2021-04-23 ENCOUNTER — Ambulatory Visit: Payer: 59 | Attending: Internal Medicine

## 2021-04-23 DIAGNOSIS — Z23 Encounter for immunization: Secondary | ICD-10-CM

## 2021-04-30 ENCOUNTER — Telehealth: Payer: Self-pay | Admitting: Family Medicine

## 2021-04-30 DIAGNOSIS — U071 COVID-19: Secondary | ICD-10-CM

## 2021-04-30 MED ORDER — MOLNUPIRAVIR EUA 200MG CAPSULE: 4.0000 | ORAL_CAPSULE | Freq: Two times a day (BID) | ORAL | 0 refills | Status: AC

## 2021-04-30 NOTE — Telephone Encounter (Signed)
Received call at 11:15 pm - pt just took a home test for covid 19 and it was positive Spoke to his wife who is also positive  Fully vaccinated Sx including cough and fever to about 101 No SOB or distress  They would like to use molnupiravir Called in for them Advised supportive measures and OTC medication as per cold or flu treatment If any severe sx seek care at ER

## 2021-05-03 ENCOUNTER — Other Ambulatory Visit (HOSPITAL_BASED_OUTPATIENT_CLINIC_OR_DEPARTMENT_OTHER): Payer: Self-pay

## 2021-05-03 MED ORDER — COVID-19 MRNA VAC-TRIS(PFIZER) 30 MCG/0.3ML IM SUSP
INTRAMUSCULAR | 0 refills | Status: DC
  Filled 2021-05-03: qty 0.3, 1d supply, fill #0

## 2021-07-22 ENCOUNTER — Other Ambulatory Visit: Payer: Self-pay | Admitting: Family

## 2021-08-17 ENCOUNTER — Other Ambulatory Visit: Payer: Self-pay

## 2021-08-17 ENCOUNTER — Ambulatory Visit (INDEPENDENT_AMBULATORY_CARE_PROVIDER_SITE_OTHER): Payer: 59 | Admitting: Family

## 2021-08-17 VITALS — BP 133/77 | HR 103 | Temp 98.9°F | Resp 16 | Ht 67.0 in | Wt 202.0 lb

## 2021-08-17 DIAGNOSIS — R319 Hematuria, unspecified: Secondary | ICD-10-CM | POA: Insufficient documentation

## 2021-08-17 DIAGNOSIS — J309 Allergic rhinitis, unspecified: Secondary | ICD-10-CM | POA: Diagnosis not present

## 2021-08-17 NOTE — Patient Instructions (Signed)
Please start zyrtec 10mg  once daily. If needed you can add in flonase 2 sprays each nostril once daily.

## 2021-08-17 NOTE — Assessment & Plan Note (Signed)
Uncontrolled. Not taking zyrtec. Recommended following:  Please start zyrtec 10mg  once daily. If needed you can add in flonase 2 sprays each nostril once daily.

## 2021-08-17 NOTE — Assessment & Plan Note (Signed)
Suspect that he passed a kidney stone based on history. Symptoms resolved.  Will refer to Urology for further evaluation.

## 2021-08-17 NOTE — Progress Notes (Signed)
Subjective:     Patient ID: Joel Chen, male    DOB: [birth year], 65 y.o.   MRN: [id number]  Chief Complaint  Patient presents with   Nephrolithiasis    Patient reports passing a renal calculus 2 days ago with gross hematuria. Reports  having some right flank discomfort before that.      HPI  Reports 2 days ago he had right sided low back pressure. He did not have any pain with urination.  He did not have a fever.  Reports symptoms have resolved.    Nasal congestion- he [place] using mucinex.  He has not tried zyrtec.  He has tried flonase with some modest improvement. Drainage is clear. Using navage at home which helps a little.    Health Maintenance Due  Topic Date Due   INFLUENZA VACCINE  04/12/2021   COVID-19 Vaccine (5 - Booster for [employer] series) 06/18/2021    Past Medical History:  Diagnosis Date   Chicken pox    Hyperlipidemia 04/29/2011   Hypertension    Mumps    Personal history of hyperthyroidism    "pt states his hyperthyroidism went away and hasn't had to take meds in 8 yrs."    Past Surgical History:  Procedure Laterality Date   COLONOSCOPY  09/2009   hemorrhoids   ROOT CANAL  1998   WISDOM TOOTH EXTRACTION  1994    Family History  Problem Relation Age of Onset   Heart disease Mother        Living   Hypertension Mother    Cancer Father 72       lung-Deceased   Dementia Maternal Grandfather    Heart disease Maternal Aunt    Hypertension Maternal Aunt    Other Brother        Hyperglycemia/Pre-diabetes   Healthy Sister        x2   Healthy Brother        x1 [#2]   Allergies Daughter     Social History   Socioeconomic History   Marital status: Married    Spouse name: Not on file   Number of children: 2   Years of education: Not on file   Highest education level: Not on file  Occupational History   Occupation: DJ    Comment: self-employed  Tobacco Use   Smoking status: Passive Smoke Exposure - Never Smoker   Smokeless tobacco: Never   Substance and Sexual Activity   Alcohol use: Yes    Alcohol/week: 6.0 standard drinks    Types: 6 Cans of beer per week    Comment: "couple" a week   Drug use: No   Sexual activity: Yes    Partners: Female  Other Topics Concern   Not on file  Social History Narrative   Regular exercise:  Active at work. Walks 2 x weekly.   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 30 tablet 11   COVID-19 mRNA Vac-TriS, Pfizer, SUSP injection Inject into the muscle. 0.3 mL 0   fluticasone (FLONASE) 50 MCG/ACT nasal spray Place 2 sprays into both nostrils daily. 16 g 6   omega-3 fish oil (MAXEPA) 1000 MG CAPS capsule Take 1 capsule by mouth every other day. As Directed     tamsulosin (FLOMAX) 0.4 MG CAPS capsule Take 1 capsule (0.4 mg total) by mouth daily. 90 capsule 3   No facility-administered medications prior to visit.    No Known Allergies  ROS    See HPI Objective:    Physical Exam Constitutional:      General: He is not in acute distress.    Appearance: He is well-developed.  HENT:     Head: Normocephalic and atraumatic.  Cardiovascular:     Rate and Rhythm: Normal rate and regular rhythm.     Heart sounds: No murmur heard. Pulmonary:     Effort: Pulmonary effort is normal. No respiratory distress.     Breath sounds: Normal breath sounds. No wheezing or rales.  Abdominal:     General:  There is no distension.     Palpations: Abdomen is soft.     Tenderness: There is no right CVA tenderness or left CVA tenderness.  Skin:    General: Skin is warm and dry.  Neurological:     Mental Status: He is alert and oriented to person, place, and time.  Psychiatric:        Behavior: Behavior normal.        Thought Content: Thought content normal.    BP 133/77 (BP Location: Right Arm, Patient Position: Sitting, Cuff Size: Small)   Pulse (!) 103   Temp 98.9 F (37.2 C) (Oral)   Resp 16   Ht 5\' 7"  (1.702 m)   Wt 202 lb (91.6 kg)   SpO2 100%   BMI 31.64 kg/m  Wt Readings from Last 3 Encounters:  08/17/21 202 lb (91.6 kg)  03/31/21 203 lb (92.1 kg)  12/30/20 209 lb (94.8 kg)       Assessment & Plan:   Problem List Items Addressed This Visit       Unprioritized   Hematuria - Primary    Suspect that he passed a kidney stone based on history. Symptoms resolved.  Will refer to Urology for further evaluation.       Relevant Orders   Ambulatory referral to Urology   Urinalysis, Routine w reflex microscopic   Urine Culture   Allergic rhinitis    Uncontrolled. Not taking zyrtec. Recommended following:  Please start zyrtec 10mg  once daily. If needed you can add in flonase 2 sprays each nostril once daily.        I am having Council Mechanic maintain his omega-3 fish oil, cetirizine, fluticasone, tamsulosin, COVID-19 mRNA Vac-TriS (Pfizer), and amLODipine.  No orders of the defined types were placed in this encounter.

## 2021-08-18 LAB — URINALYSIS, ROUTINE W REFLEX MICROSCOPIC
Bilirubin Urine: NEGATIVE
Hgb urine dipstick: NEGATIVE
Ketones, ur: NEGATIVE
Leukocytes,Ua: NEGATIVE
Nitrite: NEGATIVE
RBC / HPF: NONE SEEN
Specific Gravity, Urine: 1.025 (ref 1.000–1.030)
Total Protein, Urine: NEGATIVE
Urine Glucose: NEGATIVE
Urobilinogen, UA: 0.2 (ref 0.0–1.0)
pH: 6 (ref 5.0–8.0)

## 2021-08-18 LAB — URINE CULTURE
MICRO NUMBER:: 12720369
Result:: NO GROWTH
SPECIMEN QUALITY:: ADEQUATE

## 2021-11-15 ENCOUNTER — Ambulatory Visit (INDEPENDENT_AMBULATORY_CARE_PROVIDER_SITE_OTHER): Payer: 59 | Admitting: Family

## 2021-11-15 VITALS — BP 120/65 | HR 103 | Temp 98.2°F | Resp 16 | Ht 67.0 in | Wt 207.0 lb

## 2021-11-15 DIAGNOSIS — Z0000 Encounter for general adult medical examination without abnormal findings: Secondary | ICD-10-CM

## 2021-11-15 DIAGNOSIS — M25562 Pain in left knee: Secondary | ICD-10-CM

## 2021-11-15 DIAGNOSIS — I10 Essential (primary) hypertension: Secondary | ICD-10-CM | POA: Diagnosis not present

## 2021-11-15 DIAGNOSIS — G8929 Other chronic pain: Secondary | ICD-10-CM | POA: Insufficient documentation

## 2021-11-15 DIAGNOSIS — Z8639 Personal history of other endocrine, nutritional and metabolic disease: Secondary | ICD-10-CM

## 2021-11-15 DIAGNOSIS — N401 Enlarged prostate with lower urinary tract symptoms: Secondary | ICD-10-CM | POA: Diagnosis not present

## 2021-11-15 DIAGNOSIS — E785 Hyperlipidemia, unspecified: Secondary | ICD-10-CM | POA: Diagnosis not present

## 2021-11-15 DIAGNOSIS — R0681 Apnea, not elsewhere classified: Secondary | ICD-10-CM

## 2021-11-15 DIAGNOSIS — J309 Allergic rhinitis, unspecified: Secondary | ICD-10-CM

## 2021-11-15 LAB — COMPREHENSIVE METABOLIC PANEL
ALT: 30 U/L (ref 0–53)
AST: 21 U/L (ref 0–37)
Albumin: 4.2 g/dL (ref 3.5–5.2)
Alkaline Phosphatase: 69 U/L (ref 39–117)
BUN: 16 mg/dL (ref 6–23)
CO2: 28 mEq/L (ref 19–32)
Calcium: 9.1 mg/dL (ref 8.4–10.5)
Chloride: 104 mEq/L (ref 96–112)
Creatinine, Ser: 1.18 mg/dL (ref 0.40–1.50)
GFR: 65.72 mL/min (ref >60.00)
Glucose, Bld: 104 mg/dL — ABNORMAL HIGH (ref 70–99)
Potassium: 3.9 mEq/L (ref 3.5–5.1)
Sodium: 141 mEq/L (ref 135–145)
Total Bilirubin: 0.8 mg/dL (ref 0.2–1.2)
Total Protein: 6.5 g/dL (ref 6.0–8.3)

## 2021-11-15 LAB — LIPID PANEL
Cholesterol: 193 mg/dL (ref 0–200)
HDL: 62.9 mg/dL (ref >39.00)
LDL Cholesterol: 115 mg/dL — ABNORMAL HIGH (ref 0–99)
NonHDL: 129.99
Total CHOL/HDL Ratio: 3
Triglycerides: 77 mg/dL (ref 0.0–149.0)
VLDL: 15.4 mg/dL (ref 0.0–40.0)

## 2021-11-15 LAB — PSA: PSA: 2.93 ng/mL (ref 0.10–4.00)

## 2021-11-15 LAB — TSH: TSH: 1.61 u[IU]/mL (ref 0.35–5.50)

## 2021-11-15 MED ORDER — MONTELUKAST SODIUM 10 MG PO TABS: 10.0000 mg | ORAL_TABLET | Freq: Every day | ORAL | 3 refills | Status: DC

## 2021-11-15 NOTE — Patient Instructions (Signed)
Please complete lab work prior to leaving.   

## 2021-11-15 NOTE — Assessment & Plan Note (Signed)
Lab Results  ?Component Value Date  ? TSH 1.69 10/16/2020  ? ? ?

## 2021-11-15 NOTE — Assessment & Plan Note (Signed)
Uncontrolled. Will add singulair. Continue zyrtec. ?

## 2021-11-15 NOTE — Assessment & Plan Note (Signed)
Discussed healthy diet, exercise, weight loss. Immunizations reviewed and up to date. Check PSA. Colo up to date.  ?

## 2021-11-15 NOTE — Assessment & Plan Note (Signed)
Lab Results  ?Component Value Date  ? PSA 3.10 10/16/2020  ? PSA 2.91 08/15/2018  ? PSA 2.44 07/08/2016  ? ? ? ?

## 2021-11-15 NOTE — Assessment & Plan Note (Signed)
Not currently on statin.  Check follow up lipids.  ?

## 2021-11-15 NOTE — Assessment & Plan Note (Addendum)
BP Readings from Last 3 Encounters:  ?11/15/21 (!) 149/77  ?08/17/21 133/77  ?03/31/21 (!) 142/65  ? ?Currently on accupril 10mg . Initial bp reading was high, but follow up bp reading was much better.  No med changes at this time.  ?

## 2021-11-15 NOTE — Assessment & Plan Note (Signed)
Likely OA. Recommended voltaren gel HS and prn tylenol.  ?

## 2021-11-15 NOTE — Assessment & Plan Note (Addendum)
New. Per wife. Will refer to sleep specialist for further evaluation.  ?

## 2021-11-15 NOTE — Progress Notes (Signed)
Subjective:   By signing my name below, I, Zite Okoli, attest that this documentation has been prepared under the direction and in the presence of Sandford Craze, NP 11/15/2021       Patient ID: Joel Chen, male    DOB: 18-Nov-1955, 66 y.o.   MRN: [id number]  No chief complaint on file.   HPI Patient is in today for a comprehensive physical exam.  Swelling- He reports that when he lays down at night, his left knee swells up. He adds it does not bother him when he wakes up but starts to hurt more in the evening. He is trying to lose weight. Sinus- He reports he is still congested and stuffed up. Adds that it is intermittent. He stopped using Flonase because it was ineffective. He still uses zyrtec everyday. Sleep Apnea- He reports that his wife has told him he snores at night and often has to wake him up because he stops breathing while sleeping. He works more in the evenings and does not realize if he is tired when he wakes up.  Colonoscopy- Last completed on 01/07/2020. There was a polyp in the sigmoid colon that was resected and retrieved. Otherwise exam was normal. Repeat in 10 years. PSA- Last checked on 10/16/2020. Reading was 3.10 Immunizations- He has received the flu, shingles and tetanus vaccines. He has 5 Covid-19 vaccines. Diet and Exercise- He is starting to exercise more and he is trying to manage a healthy diet. He is going to start using vinegar gummy bears. Vision and Dental- He is UTD on vision care.  He denies having any unexpected weight change, ear pain, hearing loss and rhinorrhea, visual disturbance, cough, chest pain and leg swelling, nausea, vomiting, diarrhea and blood in stool, or dysuria and frequency, for myalgias and arthralgias, rash, headaches, adenopathy, depression or anxiety at this time   Past Medical History:  Diagnosis Date   Chicken pox    Hyperlipidemia 04/29/2011   Hypertension    Mumps    Personal history of hyperthyroidism    "pt  states his hyperthyroidism went away and hasn't had to take meds in 8 yrs."    Past Surgical History:  Procedure Laterality Date   COLONOSCOPY  09/2009   hemorrhoids   ROOT CANAL  1998   WISDOM TOOTH EXTRACTION  1994    Family History  Problem Relation Age of Onset   Heart disease Mother        Living   Hypertension Mother    Cancer Father 51       lung-Deceased   Dementia Maternal Grandfather    Heart disease Maternal Aunt    Hypertension Maternal Aunt    Other Brother        Hyperglycemia/Pre-diabetes   Healthy Sister        x2   Healthy Brother        x1 [#2]   Allergies Daughter     Social History   Socioeconomic History   Marital status: Married    Spouse name: Not on file   Number of children: 2   Years of education: Not on file   Highest education level: Not on file  Occupational History   Occupation: DJ    Comment: self-employed  Tobacco Use   Smoking status: Passive Smoke Exposure - Never Smoker   Smokeless tobacco: Never  Substance and Sexual Activity   Alcohol use: Yes    Alcohol/week: 6.0 standard drinks    Types: 6 Cans of beer  per week    Comment: "couple" a week   Drug use: No   Sexual activity: Yes    Partners: Female  Other Topics Concern   Not on file  Social History Narrative   Regular exercise:  Active at work. Walks 2 x weekly.   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 30 tablet 11   tamsulosin (FLOMAX) 0.4 MG CAPS capsule Take 1 capsule (0.4 mg total) by mouth daily. 90 capsule 3   COVID-19 mRNA Vac-TriS, Pfizer, SUSP injection Inject into the muscle. 0.3 mL 0   fluticasone (FLONASE) 50 MCG/ACT nasal spray Place 2 sprays into both nostrils daily. 16 g 6   omega-3 fish oil (MAXEPA) 1000 MG CAPS capsule Take 1 capsule by mouth every other day. As Directed     No facility-administered medications prior to visit.    No Known Allergies  Review of Systems  Constitutional:  Negative for fever.  HENT:  Positive for congestion. Negative for ear pain and hearing loss.        (-)nystagmus (-)adenopathy  Eyes:  Negative for blurred vision.  Respiratory:  Negative for cough, shortness of breath and wheezing.   Cardiovascular:  Negative for chest pain and leg swelling.  Gastrointestinal:  Negative for blood in stool, diarrhea, nausea and vomiting.  Genitourinary:  Negative for dysuria and frequency.  Musculoskeletal:  Negative for joint pain and myalgias.       (+) left knee swelling   Skin:  Negative for rash.  Neurological:  Negative for headaches.  Psychiatric/Behavioral:  Negative for depression. The patient is not nervous/anxious.       Objective:    Physical Exam Constitutional:      General: He is not in acute distress.    Appearance: Normal appearance.  HENT:     Head: Normocephalic and atraumatic.     Right Ear: Tympanic membrane, ear canal and external ear normal.     Left Ear: Tympanic membrane, ear canal and external ear normal.  Cardiovascular:     Rate and Rhythm: Normal rate and regular rhythm.     Heart sounds: No murmur heard. Pulmonary:     Effort: No respiratory distress.     Breath sounds: Normal breath sounds. No wheezing or rales.  Musculoskeletal:     Comments: 5/5 strength in upper and lower extremities  Skin:    General: Skin is warm and dry.  Neurological:     Mental Status: He is alert and  oriented to person, place, and time.     Deep Tendon Reflexes:     Reflex Scores:      Patellar reflexes are 2+ on the right side and 2+ on the left side. Psychiatric:  Behavior: Behavior normal.        Thought Content: Thought content normal.    BP 120/65    Pulse (!) 103    Temp 98.2 F (36.8 C) (Oral)    Resp 16    Ht 5\' 7"  (1.702 m)    Wt 207 lb (93.9 kg)    SpO2 99%    BMI 32.42 kg/m  Wt Readings from Last 3 Encounters:  11/15/21 207 lb (93.9 kg)  08/17/21 202 lb (91.6 kg)  03/31/21 203 lb (92.1 kg)    Diabetic Foot Exam - Simple   No data filed    Lab Results  Component Value Date   WBC 4.1 08/15/2018   HGB 15.6 08/15/2018   HCT 47.3 08/15/2018   PLT 266.0 08/15/2018   GLUCOSE 84 10/16/2020   CHOL 207 (H) 10/16/2020   TRIG 252.0 (H) 10/16/2020   HDL 54.80 10/16/2020   LDLDIRECT 124.0 10/16/2020   LDLCALC 146 (H) 08/15/2018   ALT 26 10/16/2020   AST 17 10/16/2020   NA 139 10/16/2020   K 4.4 10/16/2020   CL 101 10/16/2020   CREATININE 1.28 10/16/2020   BUN 19 10/16/2020   CO2 32 10/16/2020   TSH 1.69 10/16/2020   PSA 3.10 10/16/2020   HGBA1C 5.4 07/08/2016    Lab Results  Component Value Date   TSH 1.69 10/16/2020   Lab Results  Component Value Date   WBC 4.1 08/15/2018   HGB 15.6 08/15/2018   HCT 47.3 08/15/2018   MCV 84.4 08/15/2018   PLT 266.0 08/15/2018   Lab Results  Component Value Date   NA 139 10/16/2020   K 4.4 10/16/2020   CO2 32 10/16/2020   GLUCOSE 84 10/16/2020   BUN 19 10/16/2020   CREATININE 1.28 10/16/2020   BILITOT 0.9 10/16/2020   ALKPHOS 78 10/16/2020   AST 17 10/16/2020   ALT 26 10/16/2020   PROT 6.9 10/16/2020   ALBUMIN 4.3 10/16/2020   CALCIUM 9.4 10/16/2020   GFR 60.07 10/16/2020   Lab Results  Component Value Date   CHOL 207 (H) 10/16/2020   Lab Results  Component Value Date   HDL 54.80 10/16/2020   Lab Results  Component Value Date   LDLCALC 146 (H) 08/15/2018   Lab Results  Component Value  Date   TRIG 252.0 (H) 10/16/2020   Lab Results  Component Value Date   CHOLHDL 4 10/16/2020   Lab Results  Component Value Date   HGBA1C 5.4 07/08/2016       Assessment & Plan:   Problem List Items Addressed This Visit       Unprioritized   Witnessed episode of apnea - Primary    Per wife. Will refer to sleep specialist for further evaluation.       Relevant Orders   Ambulatory referral to Pulmonology   Preventative health care    Discussed healthy diet, exercise, weight loss. Immunizations reviewed and up to date. Check PSA. Colo up to date.       Personal history of hyperthyroidism    Lab Results  Component Value Date   TSH 1.69 10/16/2020        Relevant Orders   TSH   Hypertension    BP Readings from Last 3 Encounters:  11/15/21 (!) 149/77  08/17/21 133/77  03/31/21 (!) 142/65  Currently on accupril 10mg . Initial bp reading was high, but follow up bp reading was much better.  No med changes at this time.  Hyperlipidemia    Not currently on statin.  Check follow up lipids.       Relevant Orders   Comp Met (CMET)   Lipid panel   Chronic pain of left knee    Likely OA. Recommended voltaren gel HS and prn tylenol.       Benign prostatic hyperplasia with lower urinary tract symptoms    Lab Results  Component Value Date   PSA 3.10 10/16/2020   PSA 2.91 08/15/2018   PSA 2.44 07/08/2016         Relevant Orders   PSA   Allergic rhinitis    Uncontrolled. Will add singulair. Continue zyrtec.      Relevant Medications   montelukast (SINGULAIR) 10 MG tablet    Meds ordered this encounter  Medications   montelukast (SINGULAIR) 10 MG tablet    Sig: Take 1 tablet (10 mg total) by mouth at bedtime.    Dispense:  30 tablet    Refill:  3    Order Specific Question:   Supervising Provider    Answer:   Penni Homans A [[id number]]    I,Zite Okoli,acting as a scribe for Nance Pear, NP.,have documented all relevant documentation on the  behalf of Nance Pear, NP,as directed by  Nance Pear, NP while in the presence of Nance Pear, NP.   I, Debbrah Alar, NP , personally preformed the services described in this documentation.  All medical record entries made by the scribe were at my direction and in my presence.  I have reviewed the chart and discharge instructions (if applicable) and agree that the record reflects my personal performance and is accurate and complete. 11/15/2021

## 2021-11-16 ENCOUNTER — Encounter: Payer: Self-pay | Admitting: Family

## 2021-11-16 NOTE — Progress Notes (Signed)
Mailed out to patient 

## 2021-11-19 ENCOUNTER — Other Ambulatory Visit: Payer: Self-pay | Admitting: Family

## 2021-12-10 NOTE — Progress Notes (Signed)
12/13/21- 65 yoM never smoker for sleep evaluation with concern of witnessed apneas courtesy of Sandford Craze, NP ?Medical problem list includes HTN, Allergic Rhinitis, BPH, Hyperlipidemia,  ?Epworth score- ?Body weight today-206 lbs ?Covid vax-5 Phizer ?Flu vax-had ?-----He was told that he snores and stops breathing, he has trouble with sinus pressure,  ?Wife has complained for years.  He minimizes daytime sleepiness.  No sleep medicines.  Little caffeine.  Brother may have OSA.  No ENT surgery.  No heart or lung problems recognized. ?Recent sinus pressure discomfort despite saline rinses and Mucinex. ? ?Prior to Admission medications   ?Medication Sig Start Date End Date Taking? Authorizing Provider  ?amLODipine (NORVASC) 5 MG tablet TAKE 1 TABLET(5 MG) BY MOUTH DAILY 11/19/21  Yes Sandford Craze, NP  ?cetirizine (ZYRTEC) 10 MG tablet Take 1 tablet (10 mg total) by mouth daily. 10/16/20  Yes Sandford Craze, NP  ?montelukast (SINGULAIR) 10 MG tablet Take 1 tablet (10 mg total) by mouth at bedtime. 11/15/21  Yes Sandford Craze, NP  ?tamsulosin (FLOMAX) 0.4 MG CAPS capsule Take 1 capsule (0.4 mg total) by mouth daily. 12/29/20  Yes Sandford Craze, NP  ?quinapril (ACCUPRIL) 10 MG tablet Take 1 tablet (10 mg total) by mouth every morning. 05/20/11 11/22/11  Sandford Craze, NP  ? ?. ?Past Medical History:  ?Diagnosis Date  ? Chicken pox   ? Hyperlipidemia 04/29/2011  ? Hypertension   ? Mumps   ? Personal history of hyperthyroidism   ? "pt states his hyperthyroidism went away and hasn't had to take meds in 8 yrs."  ? ?Family History  ?Problem Relation Age of Onset  ? Heart disease Mother   ?     Living  ? Hypertension Mother   ? Cancer Father 55  ?     lung-Deceased  ? Healthy Sister   ?     x2  ? Other Brother   ?     Hyperglycemia/Pre-diabetes  ? Healthy Brother   ?     x1 [#2]  ? Heart disease Maternal Aunt   ? Hypertension Maternal Aunt   ? Dementia Maternal Grandfather   ? Allergies Daughter   ?  Alpha-1 antitrypsin deficiency Neg Hx   ? Liver disease Neg Hx   ? ?Social History  ? ?Socioeconomic History  ? Marital status: Married  ?  Spouse name: Not on file  ? Number of children: 2  ? Years of education: Not on file  ? Highest education level: Not on file  ?Occupational History  ? 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 Smoking status: Never  ?  Passive exposure: Yes  ? Smokeless tobacco: Never  ?Substance and Sexual Activity  ? Alcohol use: Yes  ?  Alcohol/week: 6.0 standard drinks  ?  Types: 6 Cans of beer per week  ?  Comment: "couple" a week  ? Drug use: No  ? Sexual activity: Yes  ?  Partners: Female  ?Other Topics Concern  ? Not on file  ?Social History Narrative  ? Regular exercise:  Active at work. Walks 2 x weekly.  ? Caffeine use: 1 cup coffee daily  ? Building control surveyor at [employer]   ? Lost a daughter to MVA  ? Has another daughter  ?   ?   ?   ?   ? ?Social Determinants of Health  ? ?Financial Resource Strain: Not on file  ?Food Insecurity: Not on file  ?Transportation Needs: Not on file  ?  Physical Activity: Not on file  ?Stress: Not on file  ?Social Connections: Not on file  ?Intimate Partner Violence: Not on file  ? ?ROS-see HPI   + = positive ?Constitutional:    weight loss, night sweats, fevers, chills, fatigue, lassitude. ?HEENT:    headaches, difficulty swallowing, tooth/dental problems, sore throat,  ?     sneezing, itching, ear ache, nasal congestion, post nasal drip, snoring ?CV:    chest pain, orthopnea, PND, swelling in lower extremities, anasarca,                                  dizziness, palpitations ?Resp:   shortness of breath with exertion or at rest.   ?             productive cough,   non-productive cough, coughing up of blood.   ?           change in color of mucus.  wheezing.   ?Skin:    rash or lesions. ?GI:  No-   heartburn, indigestion, abdominal pain, nausea, vomiting, diarrhea,  ?               change in bowel habits, loss of appetite ?GU:  dysuria, change in color of urine, no urgency or frequency.   flank pain. ?MS:   joint pain, stiffness, decreased range of motion, back pain. ?Neuro-     nothing unusual ?Psych:  change in mood or affect.  depression or anxiety.   memory loss. ? ?OBJ- Physical Exam ?General- Alert, Oriented, Affect-appropriate, Distress- none acute, + overweight ?Skin- rash-none, lesions- none, excoriation- none ?Lymphadenopathy- none ?Head- atraumatic ?           Eyes- Gross vision intact, PERRLA, conjunctivae and secretions clear ?           Ears- Hearing, canals-normal ?           Nose- Clear, no-Septal dev, mucus, polyps, erosion, perforation  ?           Throat- Mallampati IV , mucosa clear , drainage- none, tonsils- atrophic, +teeth ?Neck- flexible , trachea midline, no stridor , thyroid nl, carotid no bruit ?Chest - symmetrical excursion , unlabored ?          Heart/CV- RRR , no murmur , no gallop  , no rub, nl s1 s2 ?                          - JVD- none , edema- none, stasis changes- none, varices- none ?          Lung- clear to P&A, wheeze- none, cough- none , dullness-none, rub- none ?          Chest wall-  ?Abd-  ?Br/ Gen/ Rectal- Not done, not indicated ?Extrem- cyanosis- none, clubbing, none, atrophy- none, strength- nl ?Neuro- grossly intact to observation ? ?

## 2021-12-13 ENCOUNTER — Ambulatory Visit (INDEPENDENT_AMBULATORY_CARE_PROVIDER_SITE_OTHER): Payer: 59 | Admitting: Internal Medicine

## 2021-12-13 ENCOUNTER — Encounter: Payer: Self-pay | Admitting: Internal Medicine

## 2021-12-13 VITALS — BP 130/80 | HR 80 | Temp 98.2°F | Ht 67.0 in | Wt 206.0 lb

## 2021-12-13 DIAGNOSIS — R0683 Snoring: Secondary | ICD-10-CM | POA: Diagnosis not present

## 2021-12-13 DIAGNOSIS — I10 Essential (primary) hypertension: Secondary | ICD-10-CM | POA: Diagnosis not present

## 2021-12-13 NOTE — Patient Instructions (Signed)
Order- schedule home sleep test dx snoring  Please call us about 2 weeks after your sleep test for results and recommendations 

## 2021-12-15 ENCOUNTER — Encounter: Payer: Self-pay | Admitting: Internal Medicine

## 2021-12-15 NOTE — Assessment & Plan Note (Signed)
Obstructive sleep apnea seems likely based on history and exam.  Appropriate discussion done.  Questions answered.  Importance of safe driving, weight control, testing and treatment options were reviewed. ?Plan-sleep study ?

## 2021-12-15 NOTE — Assessment & Plan Note (Signed)
Potential association of hypertension with undertreated OSA discussed. ?

## 2021-12-19 ENCOUNTER — Other Ambulatory Visit: Payer: Self-pay | Admitting: Family

## 2021-12-27 ENCOUNTER — Telehealth: Payer: Self-pay | Admitting: Family

## 2021-12-27 NOTE — Telephone Encounter (Signed)
Called David back but no answer, lvm for him to be aware forms have not been received.  ?

## 2021-12-27 NOTE — Telephone Encounter (Signed)
Joel Chen from [employer] [place] states they faxed over a request for physician's statement on life insurance. They would like an update on the status of that. Please advise.  ?

## 2021-12-29 NOTE — Telephone Encounter (Signed)
Spoke to David and he reports we should be getting this forms in the "next couple of days" ?

## 2021-12-31 ENCOUNTER — Ambulatory Visit (INDEPENDENT_AMBULATORY_CARE_PROVIDER_SITE_OTHER): Payer: 59 | Admitting: Family Medicine

## 2021-12-31 ENCOUNTER — Ambulatory Visit (HOSPITAL_BASED_OUTPATIENT_CLINIC_OR_DEPARTMENT_OTHER)
Admission: RE | Admit: 2021-12-31 | Discharge: 2021-12-31 | Disposition: A | Discharge status: Home / Self Care | Payer: 59 | Source: Ambulatory Visit | Attending: Family Medicine | Admitting: Family Medicine

## 2021-12-31 ENCOUNTER — Encounter: Payer: Self-pay | Admitting: Family Medicine

## 2021-12-31 VITALS — BP 124/72 | HR 75 | Temp 98.0°F | Resp 18 | Ht 67.0 in | Wt 206.8 lb

## 2021-12-31 DIAGNOSIS — M25422 Effusion, left elbow: Secondary | ICD-10-CM

## 2021-12-31 DIAGNOSIS — M7022 Olecranon bursitis, left elbow: Secondary | ICD-10-CM | POA: Insufficient documentation

## 2021-12-31 NOTE — Progress Notes (Signed)
? ?Subjective:  ? ?By signing my name below, I, Joel Chen, attest that this documentation has been prepared under the direction and in the presence of Joel Held, DO. 12/31/2021   ? ? Patient ID: Joel Chen, male    DOB: 1956/06/05, 66 y.o.   MRN: [id number] ? ?Chief Complaint  ?Patient presents with  ? Joint Swelling  ?  Left elbow, Pt states having swelling, no pain, Pt states he have bumped his elbow about 3 weeks ago.   ? ? ?HPI ?Patient is in today for an office visit. ? ?He reports his right elbow is swollen. It is non-tender to touch. He thinks he hit it when he was carrying around his equipment. The swelling started yesterday and he also notes some tingling from his elbow. ? ?Past Medical History:  ?Diagnosis Date  ? Chicken pox   ? Hyperlipidemia 04/29/2011  ? Hypertension   ? Mumps   ? Personal history of hyperthyroidism   ? "pt states his hyperthyroidism went away and hasn't had to take meds in 8 yrs."  ? ? ?Past Surgical History:  ?Procedure Laterality Date  ? COLONOSCOPY  09/2009  ? hemorrhoids  ? [place]  ? [place] EXTRACTION  1994  ? ? ?Family History  ?Problem Relation Age of Onset  ? Heart disease Mother   ?     Living  ? Hypertension Mother   ? Cancer Father 31  ?     lung-Deceased  ? Joel Chen   ?     x2  ? Other Brother   ?     Hyperglycemia/Pre-diabetes  ? Joel Brother   ?     x1 [#2]  ? Heart disease Maternal Aunt   ? Hypertension Maternal Aunt   ? Dementia Maternal Grandfather   ? Allergies Joel Chen   ? Alpha-1 antitrypsin deficiency Neg Hx   ? Liver disease Neg Hx   ? ? ?Social History  ? ?Socioeconomic History  ? Marital status: Married  ?  Spouse name: Not on file  ? Number of children: 2  ? Years of education: Not on file  ? Highest education level: Not on file  ?Occupational History  ? 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 Smoking status: Never  ?  Passive exposure: Yes  ? Smokeless tobacco: Never  ?Substance and Sexual Activity  ?  Alcohol use: Yes  ?  Alcohol/week: 6.0 standard drinks  ?  Types: 6 Cans of beer per week  ?  Comment: "couple" a week  ? Drug use: No  ? Sexual activity: Yes  ?  Partners: Female  ?Other Topics Concern  ? Not on file  ?Social History Narrative  ? Regular exercise:  Active at work. Walks 2 x weekly.  ? Caffeine use: 1 cup coffee daily  ? Mining engineer at [employer]   ? Lost a Joel Chen to MVA  ? Has another Joel Chen  ?   ?   ?   ?   ? ?Social Determinants of Health  ? ?Financial Resource Strain: Not on file  ?Food Insecurity: Not on file  ?Transportation Needs: Not on file  ?Physical Activity: Not on file  ?Stress: Not on file  ?Social Connections: Not on file  ?Intimate Partner Violence: Not on file  ? ? ?Outpatient Medications Prior to Visit  ?Medication Sig Dispense Refill  ? amLODipine (NORVASC) 5 MG tablet TAKE 1 TABLET(5 MG) BY MOUTH  DAILY 90 tablet 1  ? cetirizine (ZYRTEC) 10 MG tablet Take 1 tablet (10 mg total) by mouth daily. 30 tablet 11  ? montelukast (SINGULAIR) 10 MG tablet Take 1 tablet (10 mg total) by mouth at bedtime. 30 tablet 3  ? tamsulosin (FLOMAX) 0.4 MG CAPS capsule TAKE 1 CAPSULE(0.4 MG) BY MOUTH DAILY 90 capsule 3  ? ?No facility-administered medications prior to visit.  ? ? ?No Known Allergies ? ?Review of Systems  ?Constitutional:  Negative for fever.  ?HENT:  Negative for congestion, ear pain, hearing loss, sinus pain and sore throat.   ?Eyes:  Negative for blurred vision and pain.  ?Respiratory:  Negative for cough, sputum production, shortness of breath and wheezing.   ?Cardiovascular:  Negative for chest pain, palpitations and leg swelling.  ?Gastrointestinal:  Negative for blood in stool, constipation, diarrhea, nausea and vomiting.  ?Genitourinary:  Negative for dysuria, frequency, hematuria and urgency.  ?Musculoskeletal:  Positive for joint pain (right elbow). Negative for back pain, falls and myalgias.  ?Neurological:  Negative for dizziness, sensory change, loss of  consciousness, weakness and headaches.  ?Endo/Heme/Allergies:  Negative for environmental allergies. Does not bruise/bleed easily.  ?Psychiatric/Behavioral:  Negative for depression and suicidal ideas. The patient is not nervous/anxious and does not have insomnia.   ? ?   ?Objective:  ?  ?Physical Exam ?Vitals and nursing note reviewed.  ?Constitutional:   ?   General: He is not in acute distress. ?   Appearance: Normal appearance. He is not ill-appearing.  ?HENT:  ?   Head: Normocephalic and atraumatic.  ?   Right Ear: Tympanic membrane, ear canal and external ear normal.  ?   Left Ear: Tympanic membrane, ear canal and external ear normal.  ?Eyes:  ?   Pupils: Pupils are equal, round, and reactive to light.  ?Cardiovascular:  ?   Rate and Rhythm: Normal rate and regular rhythm.  ?   Pulses: Normal pulses.  ?   Heart sounds: No murmur heard. ?  No gallop.  ?Pulmonary:  ?   Effort: Pulmonary effort is normal. No respiratory distress.  ?   Breath sounds: Normal breath sounds. No wheezing or rhonchi.  ?Abdominal:  ?   General: Bowel sounds are normal. There is no distension.  ?   Palpations: Abdomen is soft.  ?   Tenderness: There is no abdominal tenderness. There is no guarding.  ?   Hernia: No hernia is present.  ?Musculoskeletal:     ?   General: Swelling present.  ?   Cervical back: Neck supple.  ?   Comments: L elbow, red and swollen ?No pain  ?Full rom   ?Lymphadenopathy:  ?   Cervical: No cervical adenopathy.  ?Skin: ?   General: Skin is warm and dry.  ?Neurological:  ?   Mental Status: He is alert and oriented to person, place, and time.  ? ? ?BP 124/72 (BP Location: Right Arm, Patient Position: Sitting, Cuff Size: Normal)   Pulse 75   Temp 98 ?F (36.7 ?C) (Oral)   Resp 18   Ht 5\' 7"  (1.702 m)   Wt 206 lb 12.8 oz (93.8 kg)   SpO2 98%   BMI 32.39 kg/m?  ?Wt Readings from Last 3 Encounters:  ?12/31/21 206 lb 12.8 oz (93.8 kg)  ?12/13/21 206 lb (93.4 kg)  ?11/15/21 207 lb (93.9 kg)  ? ? ?Diabetic Foot Exam  - Simple   ?No data filed ?  ? ?Lab Results  ?Component Value  Date  ? WBC 4.1 08/15/2018  ? HGB 15.6 08/15/2018  ? HCT 47.3 08/15/2018  ? PLT 266.0 08/15/2018  ? GLUCOSE 104 (H) 11/15/2021  ? CHOL 193 11/15/2021  ? TRIG 77.0 11/15/2021  ? HDL 62.90 11/15/2021  ? LDLDIRECT 124.0 10/16/2020  ? LDLCALC 115 (H) 11/15/2021  ? ALT 30 11/15/2021  ? AST 21 11/15/2021  ? NA 141 11/15/2021  ? K 3.9 11/15/2021  ? CL 104 11/15/2021  ? CREATININE 1.18 11/15/2021  ? BUN 16 11/15/2021  ? CO2 28 11/15/2021  ? TSH 1.61 11/15/2021  ? PSA 2.93 11/15/2021  ? HGBA1C 5.4 07/08/2016  ? ? ?Lab Results  ?Component Value Date  ? TSH 1.61 11/15/2021  ? ?Lab Results  ?Component Value Date  ? WBC 4.1 08/15/2018  ? HGB 15.6 08/15/2018  ? HCT 47.3 08/15/2018  ? MCV 84.4 08/15/2018  ? PLT 266.0 08/15/2018  ? ?Lab Results  ?Component Value Date  ? NA 141 11/15/2021  ? K 3.9 11/15/2021  ? CO2 28 11/15/2021  ? GLUCOSE 104 (H) 11/15/2021  ? BUN 16 11/15/2021  ? CREATININE 1.18 11/15/2021  ? BILITOT 0.8 11/15/2021  ? ALKPHOS 69 11/15/2021  ? AST 21 11/15/2021  ? ALT 30 11/15/2021  ? PROT 6.5 11/15/2021  ? ALBUMIN 4.2 11/15/2021  ? CALCIUM 9.1 11/15/2021  ? GFR 65.72 11/15/2021  ? ?Lab Results  ?Component Value Date  ? CHOL 193 11/15/2021  ? ?Lab Results  ?Component Value Date  ? HDL 62.90 11/15/2021  ? ?Lab Results  ?Component Value Date  ? LDLCALC 115 (H) 11/15/2021  ? ?Lab Results  ?Component Value Date  ? TRIG 77.0 11/15/2021  ? ?Lab Results  ?Component Value Date  ? CHOLHDL 3 11/15/2021  ? ?Lab Results  ?Component Value Date  ? HGBA1C 5.4 07/08/2016  ? ? ?   ?Assessment & Plan:  ? ?Problem List Items Addressed This Visit   ? ?  ? Unprioritized  ? Olecranon bursitis of left elbow - Primary  ?  Sport med ?Ibuprofen  ?Ice  ?Xray  ? ?  ?  ? ?Other Visit Diagnoses   ? ? Elbow swelling, left      ? Relevant Orders  ? DG Elbow Complete Left  ? Ambulatory referral to Sports Medicine  ? ?  ? ? ?No orders of the defined types were placed in this  encounter. ? ? ?I,Joel Chen,acting as a Education administrator for [employer], DO.,have documented all relevant documentation on the behalf of Joel Held, DO,as directed by  Joel Held, DO while in the presence

## 2021-12-31 NOTE — Patient Instructions (Signed)
Elbow Bursitis  Elbow bursitis is the swelling of the fluid-filled sac (bursa) at the tip of the elbow. A bursa is like a cushion that protects the joint. If the bursa becomes irritated, it can fill with extra fluid and become swollen. What are the causes? Injury to the elbow. Leaning the elbow on a hard surface for a long time. Infection. Bone spurs. Certain conditions that cause swelling. Sometimes, the cause is not known. What are the signs or symptoms? The first sign of this condition is often swelling at the tip of the elbow. The swelling can grow to the size of a golf ball. Other symptoms include: Pain when bending or leaning on the elbow. Stiffness of the elbow. If the cause is infection, you may have: Redness, warmth, and tenderness. Pus coming from a cut near the elbow. How is this treated? Treatment depends on the cause. It may include: Medicines. Draining fluid from the bursa. Placing a bandage or pressure (compression) sleeve around the elbow. Wearing elbow pads. Surgery, if other treatments do not help. Follow these instructions at home: Medicines Take over-the-counter and prescription medicines only as told by your doctor. If you were prescribed an antibiotic medicine, take it as told by your doctor. Do not stop taking it even if you start to feel better. Managing pain, stiffness, and swelling     If told, put ice on the elbow. To do this: Put ice in a plastic bag. Place a towel between your skin and the bag. Leave the ice on for 20 minutes, 2-3 times a day. Take off the ice if your skin turns bright red. This is very important. If you cannot feel pain, heat, or cold, you have a greater risk of damage to the area. If told, put heat on the affected area. Do this as often as told by your doctor. Use the heat source that your doctor recommends, such as a moist heat pack or a heating pad. Place a towel between your skin and the heat source. Leave the heat on for 20-30  minutes. Take off the heat if your skin turns bright red. This is very important. If you cannot feel pain, heat, or cold, you have a greater risk of getting burned. If your bursitis is caused by an injury, follow instructions from your doctor about: Resting your elbow. Wearing a bandage or sleeve. Wear elbow pads or elbow wraps as needed. These help cushion your elbow. General instructions Avoid any activities that cause elbow pain. Ask your doctor what activities are safe for you. Keep all follow-up visits. Contact a doctor if: You have a fever. You have problems that do not get better with treatment. You have pain or swelling that: Gets worse. Goes away and then comes back. You have pus draining from your elbow. You have redness around the elbow area. Your elbow feels warm to the touch. Get help right away if: You have trouble moving your arm, hand, or fingers. Summary Elbow bursitis is the swelling of the fluid-filled sac (bursa) at the tip of the elbow. You may need to take medicine or put ice on your elbow. Contact your doctor if your problems do not get better with treatment. Also, contact your doctor if your problems go away and then come back. This information is not intended to replace advice given to you by your health care provider. Make sure you discuss any questions you have with your health care provider. Document Revised: 08/24/2021 Document Reviewed: 08/24/2021 Elsevier Patient Education    2023 Elsevier Inc.  

## 2021-12-31 NOTE — Assessment & Plan Note (Signed)
Sport med ?Ibuprofen  ?Ice  ?Xray  ?

## 2022-01-04 ENCOUNTER — Ambulatory Visit: Payer: 59

## 2022-01-04 ENCOUNTER — Other Ambulatory Visit: Payer: Self-pay | Admitting: Family Medicine

## 2022-01-04 DIAGNOSIS — S52045A Nondisplaced fracture of coronoid process of left ulna, initial encounter for closed fracture: Secondary | ICD-10-CM

## 2022-01-04 DIAGNOSIS — R0683 Snoring: Secondary | ICD-10-CM

## 2022-01-07 ENCOUNTER — Ambulatory Visit: Payer: 59 | Admitting: Orthopaedic Surgery

## 2022-01-07 ENCOUNTER — Encounter: Payer: Self-pay | Admitting: Orthopaedic Surgery

## 2022-01-07 DIAGNOSIS — M7022 Olecranon bursitis, left elbow: Secondary | ICD-10-CM

## 2022-01-07 MED ORDER — BUPIVACAINE HCL 0.5 % IJ SOLN
1.0000 mL | INTRAMUSCULAR | Status: AC | PRN
  Administered 2022-01-07: 1 mL via INTRA_ARTICULAR

## 2022-01-07 MED ORDER — METHYLPREDNISOLONE ACETATE 40 MG/ML IJ SUSP
40.0000 mg | INTRAMUSCULAR | Status: AC | PRN
  Administered 2022-01-07: 40 mg via INTRA_ARTICULAR

## 2022-01-07 MED ORDER — LIDOCAINE HCL 1 % IJ SOLN
1.0000 mL | INTRAMUSCULAR | Status: AC | PRN
  Administered 2022-01-07: 1 mL

## 2022-01-07 NOTE — Progress Notes (Signed)
? ?Office Visit Note ?  ?Patient: Joel Chen           ?Date of Birth: [birth year]           ?MRN: [id number] ?Visit Date: 01/07/2022 ?             ?Requested by: Zola Button, Myrene Buddy R, DO ?[address] ?[address] ?[place],  [place] [address] ?PCP: Sandford Craze, NP ? ? ?Assessment & Plan: ?Visit Diagnoses:  ?1. Olecranon bursitis of left elbow   ? ? ?Plan: Impression is olecranon bursitis of the left elbow.  Treatment options reviewed and patient opted for aspiration cortisone injection today.  He tolerated this well.  Compression wrap was applied.  Follow-up as needed. ? ?Follow-Up Instructions: No follow-ups on file.  ? ?Orders:  ?No orders of the defined types were placed in this encounter. ? ?No orders of the defined types were placed in this encounter. ? ? ? ? Procedures: ?Medium Joint Inj: L olecranon bursa on 01/07/2022 9:31 AM ?Details: 22 G needle ?Medications: 1 mL lidocaine 1 %; 40 mg methylPREDNISolone acetate 40 MG/ML; 1 mL bupivacaine 0.5 % ?Outcome: tolerated well, no immediate complications ? ? ? ? ?Clinical Data: ?No additional findings. ? ? ?Subjective: ?Chief Complaint  ?Patient presents with  ? Left Elbow - Pain  ? ? ?HPI ? ?Joel Chen is a 66 year old gentleman here for left elbow swelling for about 2 weeks.  He recalls bumping it.  Denies much pain.  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  Denies any restriction in range of motion.  Denies any constitutional symptoms. ? ?Review of Systems  ?Constitutional: Negative.   ?All other systems reviewed and are negative. ? ? ?Objective: ?Vital Signs: There were no vitals taken for this visit. ? ?Physical Exam ?Vitals and nursing note reviewed.  ?Constitutional:   ?   Appearance: He is well-developed.  ?HENT:  ?   Head: Normocephalic and atraumatic.  ?Eyes:  ?   Pupils: Pupils are equal, round, and reactive to light.  ?Pulmonary:  ?   Effort: Pulmonary effort is normal.  ?Abdominal:  ?   Palpations: Abdomen is soft.  ?Musculoskeletal:     ?    General: Normal range of motion.  ?   Cervical back: Neck supple.  ?Skin: ?   General: Skin is warm.  ?Neurological:  ?   Mental Status: He is alert and oriented to person, place, and time.  ?Psychiatric:     ?   Behavior: Behavior normal.     ?   Thought Content: Thought content normal.     ?   Judgment: Judgment normal.  ? ? ?Ortho Exam ? ?Examination of the left elbow shows full painless range of motion.  He has a fluctuant olecranon bursitis without any evidence of infection. ? ?Specialty Comments:  ?No specialty comments available. ? ?Imaging: ?No results found. ? ?X-rays of the left elbow independently reviewed and interpreted shows mild degenerative spurring of the olecranon and coronoid.  No evidence of acute abnormalities. ? ? ?PMFS History: ?Patient Active Problem List  ? Diagnosis Date Noted  ? Olecranon bursitis of left elbow 12/31/2021  ? Chronic pain of left knee 11/15/2021  ? Hematuria 08/17/2021  ? Allergic rhinitis 08/17/2021  ? Benign prostatic hyperplasia with lower urinary tract symptoms 07/09/2016  ? Snoring 07/06/2015  ? Witnessed episode of apnea 07/06/2015  ? Preventative health care 11/17/2013  ? Hyperlipidemia 04/29/2011  ? Hypertension 04/08/2011  ? Personal history  of hyperthyroidism 04/08/2011  ? ?Past Medical History:  ?Diagnosis Date  ? Chicken pox   ? Hyperlipidemia 04/29/2011  ? Hypertension   ? Mumps   ? Personal history of hyperthyroidism   ? "pt states his hyperthyroidism went away and hasn't had to take meds in 8 yrs."  ?  ?Family History  ?Problem Relation Age of Onset  ? Heart disease Mother   ?     Living  ? Hypertension Mother   ? Cancer Father 56  ?     lung-Deceased  ? Healthy Sister   ?     x2  ? Other Brother   ?     Hyperglycemia/Pre-diabetes  ? Healthy Brother   ?     x1 [#2]  ? Heart disease Maternal Aunt   ? Hypertension Maternal Aunt   ? Dementia Maternal Grandfather   ? Allergies Daughter   ? Alpha-1 antitrypsin deficiency Neg Hx   ? Liver disease Neg Hx   ?  ?Past  Surgical History:  ?Procedure Laterality Date  ? COLONOSCOPY  09/2009  ? hemorrhoids  ? ROOT CANAL  1998  ? WISDOM TOOTH EXTRACTION  1994  ? ?Social History  ? ?Occupational History  ? 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 Smoking status: Never  ?  Passive exposure: Yes  ? Smokeless tobacco: Never  ?Substance and Sexual Activity  ? Alcohol use: Yes  ?  Alcohol/week: 6.0 standard drinks  ?  Types: 6 Cans of beer per week  ?  Comment: "couple" a week  ? Drug use: No  ? Sexual activity: Yes  ?  Partners: Female  ? ? ? ? ? ? ?

## 2022-01-13 ENCOUNTER — Ambulatory Visit: Payer: 59

## 2022-01-13 DIAGNOSIS — G4733 Obstructive sleep apnea (adult) (pediatric): Secondary | ICD-10-CM

## 2022-01-19 DIAGNOSIS — G4733 Obstructive sleep apnea (adult) (pediatric): Secondary | ICD-10-CM

## 2022-01-25 ENCOUNTER — Telehealth: Payer: Self-pay | Admitting: Internal Medicine

## 2022-01-25 DIAGNOSIS — G4733 Obstructive sleep apnea (adult) (pediatric): Secondary | ICD-10-CM

## 2022-01-26 NOTE — Telephone Encounter (Signed)
Called and spoke with patient to let him know HST results. Him and his wife expressed understanding. Order has been placed for CPAP set up. Nothing further needed at this time. ?

## 2022-01-26 NOTE — Telephone Encounter (Signed)
Called patient and he states that he would like the results of the home sleep test that he completed on 01/14/22. I advised patient that i would reach out to Dr Maple Hudson to obtain those results if and when ready.  ? ?Dr young please advise ?

## 2022-01-26 NOTE — Telephone Encounter (Signed)
Sleep study showed severe obstructive sleep apnea, about 32 apneas/ hour with drops in blood oxygen level. ? ?I recommend we order new DME, new CPAP auto 5-20, mask of choice, humidifier, supplies, AirView/ card ? ?Needs return ov in 31-90 days after getting machine please ?

## 2022-02-22 ENCOUNTER — Ambulatory Visit: Payer: 59 | Admitting: Physician Assistant

## 2022-02-22 DIAGNOSIS — M7022 Olecranon bursitis, left elbow: Secondary | ICD-10-CM

## 2022-02-22 DIAGNOSIS — M25522 Pain in left elbow: Secondary | ICD-10-CM

## 2022-02-22 MED ORDER — BUPIVACAINE HCL 0.25 % IJ SOLN
0.6600 mL | INTRAMUSCULAR | Status: AC | PRN
  Administered 2022-02-22: .66 mL via INTRA_ARTICULAR

## 2022-02-22 MED ORDER — LIDOCAINE HCL 1 % IJ SOLN
1.0000 mL | INTRAMUSCULAR | Status: AC | PRN
  Administered 2022-02-22: 1 mL

## 2022-02-22 NOTE — Progress Notes (Signed)
Office Visit Note   Patient: Joel Chen           Date of Birth: 03/11/1956           MRN: [id number] Visit Date: 02/22/2022              Requested by: Sandford Craze, NP [address] [place],  [place] [address] PCP: Sandford Craze, NP   Assessment & Plan: Visit Diagnoses:  1. Olecranon bursitis of left elbow     Plan: Impression is recurrent left elbow olecranon bursitis.  Low suspicion for septic bursitis today.  I have agreed to reaspirate the bursa but it is too early to inject.  We have also discussed surgical intervention to include olecranon bursectomy.  He will follow-up with [place] as needed.  Call with concerns or questions.  Follow-Up Instructions: Return if symptoms worsen or fail to improve.   Orders:  No orders of the defined types were placed in this encounter.  No orders of the defined types were placed in this encounter.     Procedures: Medium Joint Inj: R olecranon bursa on 02/22/2022 10:37 AM Details: 22 G needle Medications: 1 mL lidocaine 1 %; 0.66 mL bupivacaine 0.25 %      Clinical Data: No additional findings.   Subjective: Chief Complaint  Patient presents with   Left Elbow - Pain    HPI patient is a pleasant 66 year old gentleman who comes in today with recurrent left elbow swelling to the olecranon.  He was seen in our office about 6 weeks ago for this.  This appeared to have initially started after bumping his elbow on a wall.  When he was seen in our office the olecranon bursa was aspirated and injected with cortisone.  He had relief in symptoms for a few weeks.  The swelling has recurred.  He denies any pain.  No fevers or chills.  Review of Systems as detailed in HPI.  All others reviewed and are negative.   Objective: Vital Signs: There were no vitals taken for this visit.  Physical Exam well-developed well-nourished gentleman in no acute distress.  Alert and oriented x3.  Ortho Exam left elbow exam  shows a golf ball sized olecranon bursa.  No tenderness.  No skin changes.  No warmth.  Full range of motion of the elbow without pain.  He is neurovascular intact distally.  Specialty Comments:  No specialty comments available.  Imaging: X-rays reviewed by me in canopy reveal small osteophyte formation to the olecranon   PMFS History: Patient Active Problem List   Diagnosis Date Noted   Olecranon bursitis of left elbow 12/31/2021   Chronic pain of left knee 11/15/2021   Hematuria 08/17/2021   Allergic rhinitis 08/17/2021   Benign prostatic hyperplasia with lower urinary tract symptoms 07/09/2016   Snoring 07/06/2015   Witnessed episode of apnea 07/06/2015   Preventative health care 11/17/2013   Hyperlipidemia 04/29/2011   Hypertension 04/08/2011   Personal history of hyperthyroidism 04/08/2011   Past Medical History:  Diagnosis Date   Chicken pox    Hyperlipidemia 04/29/2011   Hypertension    Mumps    Personal history of hyperthyroidism    "pt states his hyperthyroidism went away and hasn't had to take meds in 8 yrs."    Family History  Problem Relation Age of Onset   Heart disease Mother        Living   Hypertension Mother    Cancer Father 29  lung-Deceased   Healthy Sister        x2   Other Brother        Hyperglycemia/Pre-diabetes   Healthy Brother        x1 [#2]   Heart disease Maternal Aunt    Hypertension Maternal Aunt    Dementia Maternal Grandfather    Allergies Daughter    Alpha-1 antitrypsin deficiency Neg Hx    Liver disease Neg Hx     Past Surgical History:  Procedure Laterality Date   COLONOSCOPY  09/2009   hemorrhoids   ROOT CANAL  1998   WISDOM TOOTH EXTRACTION  1994   Social History   Occupational History   Occupation: DJ    Comment: self-employed  Tobacco Use   Smoking status: Never    Passive exposure: Yes   Smokeless tobacco: Never  Substance and Sexual Activity   Alcohol use: Yes    Alcohol/week: 6.0 standard drinks of  alcohol    Types: 6 Cans of beer per week    Comment: "couple" a week   Drug use: No   Sexual activity: Yes    Partners: Female

## 2022-02-23 ENCOUNTER — Other Ambulatory Visit: Payer: Self-pay | Admitting: Family

## 2022-02-23 DIAGNOSIS — J309 Allergic rhinitis, unspecified: Secondary | ICD-10-CM

## 2022-02-28 ENCOUNTER — Telehealth: Payer: Self-pay | Admitting: Orthopaedic Surgery

## 2022-02-28 NOTE — Telephone Encounter (Signed)
Pt called requesting a call back from Dr. Roda Shutters. Pt had an appt with PA [facility] for drainage of left elbow and the knot is still there. Pt is asking for a call back. Please call pt and his wife Margaretha Glassing at [phone].

## 2022-03-01 NOTE — Telephone Encounter (Signed)
Can y'all call patient and schedule him to come in and see xu to discuss possible bursectomy

## 2022-03-01 NOTE — Telephone Encounter (Signed)
Called patient. He has stepped out to the store. He will call us back to schedule an appointment with Dr.Xu.

## 2022-03-10 ENCOUNTER — Ambulatory Visit: Payer: 59 | Admitting: Orthopaedic Surgery

## 2022-03-10 ENCOUNTER — Encounter: Payer: Self-pay | Admitting: Orthopaedic Surgery

## 2022-03-10 VITALS — Ht 67.0 in | Wt 195.0 lb

## 2022-03-10 DIAGNOSIS — M7022 Olecranon bursitis, left elbow: Secondary | ICD-10-CM

## 2022-03-10 NOTE — Progress Notes (Signed)
Office Visit Note   Patient: Joel Chen           Date of Birth: May 19, 1956           MRN: [id number] Visit Date: 03/10/2022              Requested by: Sandford Craze, NP [address] [place],  [place] [address] PCP: Sandford Craze, NP   Assessment & Plan: Visit Diagnoses:  1. Olecranon bursitis of left elbow     Plan: Treatment options reviewed again for olecranon bursitis including doing nothing, reaspiration cortisone injection, surgical bursectomy.  He will think about his options and let us know.  Follow-Up Instructions: No follow-ups on file.   Orders:  No orders of the defined types were placed in this encounter.  No orders of the defined types were placed in this encounter.     Procedures: No procedures performed   Clinical Data: No additional findings.   Subjective: Chief Complaint  Patient presents with   Left Elbow - Pain    Discuss surgery    HPI Patient returns today for recurrent left olecranon bursitis Review of Systems   Objective: Vital Signs: Ht 5\' 7"  (1.702 m)   Wt 195 lb (88.5 kg)   BMI 30.54 kg/m   Physical Exam  Ortho Exam Examination left elbow shows recurrent olecranon bursitis.  No evidence of infection.  Full painless range of motion of the elbow Specialty Comments:  No specialty comments available.  Imaging: No results found.   PMFS History: Patient Active Problem List   Diagnosis Date Noted   Olecranon bursitis of left elbow 12/31/2021   Chronic pain of left knee 11/15/2021   Hematuria 08/17/2021   Allergic rhinitis 08/17/2021   Benign prostatic hyperplasia with lower urinary tract symptoms 07/09/2016   Snoring 07/06/2015   Witnessed episode of apnea 07/06/2015   Preventative health care 11/17/2013   Hyperlipidemia 04/29/2011   Hypertension 04/08/2011   Personal history of hyperthyroidism 04/08/2011   Past Medical History:  Diagnosis Date   Chicken pox    Hyperlipidemia  04/29/2011   Hypertension    Mumps    Personal history of hyperthyroidism    "pt states his hyperthyroidism went away and hasn't had to take meds in 8 yrs."    Family History  Problem Relation Age of Onset   Heart disease Mother        Living   Hypertension Mother    Cancer Father 40       lung-Deceased   Healthy Sister        x2   Other Brother        Hyperglycemia/Pre-diabetes   Healthy Brother        x1 [#2]   Heart disease Maternal Aunt    Hypertension Maternal Aunt    Dementia Maternal Grandfather    Allergies Daughter    Alpha-1 antitrypsin deficiency Neg Hx    Liver disease Neg Hx     Past Surgical History:  Procedure Laterality Date   COLONOSCOPY  09/2009   hemorrhoids   ROOT CANAL  1998   WISDOM TOOTH EXTRACTION  1994   Social History   Occupational History   Occupation: DJ    Comment: self-employed  Tobacco Use   Smoking status: Never    Passive exposure: Yes   Smokeless tobacco: Never  Substance and Sexual Activity   Alcohol use: Yes    Alcohol/week: 6.0 standard drinks of alcohol  Types: 6 Cans of beer per week    Comment: "couple" a week   Drug use: No   Sexual activity: Yes    Partners: Female

## 2022-03-17 ENCOUNTER — Ambulatory Visit: Payer: 59 | Admitting: Internal Medicine

## 2022-03-29 ENCOUNTER — Encounter: Payer: Self-pay | Admitting: Family Medicine

## 2022-03-29 ENCOUNTER — Ambulatory Visit (INDEPENDENT_AMBULATORY_CARE_PROVIDER_SITE_OTHER): Payer: 59 | Admitting: Family Medicine

## 2022-03-29 VITALS — BP 119/64 | HR 76 | Ht 67.0 in | Wt 207.2 lb

## 2022-03-29 DIAGNOSIS — H5789 Other specified disorders of eye and adnexa: Secondary | ICD-10-CM | POA: Diagnosis not present

## 2022-03-29 NOTE — Progress Notes (Signed)
   Acute Office Visit  Subjective:     Patient ID: Joel Chen, male    DOB: [birth year], 66 y.o.   MRN: [id number]  CC: eye problem   HPI Patient is in today for eye problem.   Patient reports that last Thursday (about 5-6 days ago) he was walking on his porch when he felt something hit his eye (unsure what it could have been). He immediately irrigated his eye, but has continues with redness and irritation, though no longer has the scratchy feeling of something being in his eye. Yesterday he felt like he was seeing a white film/haze in that eye, but it has resolved today. He denies any pain, purulent drainage, or vision changes.    ROS All review of systems negative except what is listed in the HPI      Objective:    BP 119/64   Pulse 76   Ht 5\' 7"  (1.702 m)   Wt 207 lb 3.2 oz (94 kg)   BMI 32.45 kg/m    Physical Exam Vitals reviewed.  Constitutional:      Appearance: Normal appearance.  Eyes:     General: Vision grossly intact. No visual field deficit.       Left eye: No hordeolum.     Extraocular Movements: Extraocular movements intact.     Left eye: Normal extraocular motion and no nystagmus.     Conjunctiva/sclera:     Left eye: Left conjunctiva is injected.     Pupils: Pupils are equal, round, and reactive to light.     Comments: Left eye with redness, watery discharge, no obvious foreign bodies visible; mildly swollen  Skin:    General: Skin is warm and dry.  Neurological:     General: No focal deficit present.     Mental Status: He is alert and oriented to person, place, and time. Mental status is at baseline.  Psychiatric:        Mood and Affect: Mood normal.        Behavior: Behavior normal.        Thought Content: Thought content normal.        Judgment: Judgment normal.       No results found for any visits on 03/29/22.      Assessment & Plan:   Problem List Items Addressed This Visit   None Visit Diagnoses     Eye redness    -   Primary Possible foreign body given HPI. No visible foreign body or abrasions on exam. Recommend he continue with gentle irrigation and avoid touching eyes. He needs to be seen by his ophthalmologist within the next day or two. No pain or vision changes at this time. Patient aware of signs/symptoms requiring further/urgent evaluation.        No orders of the defined types were placed in this encounter.   Return if symptoms worsen or fail to improve.  03/31/22, NP

## 2022-04-03 NOTE — Progress Notes (Deleted)
12/13/21- 65 yoM never smoker for sleep evaluation with concern of witnessed apneas courtesy of Sandford Craze, NP Medical problem list includes HTN, Allergic Rhinitis, BPH, Hyperlipidemia,  Epworth score- Body weight today-206 lbs Covid vax-5 Phizer Flu vax-had -----He was told that he snores and stops breathing, he has trouble with sinus pressure,  Wife has complained for years.  He minimizes daytime sleepiness.  No sleep medicines.  Little caffeine.  Brother may have OSA.  No ENT surgery.  No heart or lung problems recognized. Recent sinus pressure discomfort despite saline rinses and Mucinex.  04/04/22- 65 yoM never smoker followed forr OSA, complicated by  Allergic Rhinitis, BPH, Hyperlipidemia,  HST 01/14/22- AHI 31.8/ hr, desaturation to 83%, body weight 318 lbs CPAP auto 5-20/ [facility] ordered 01/26/22 Download- Body weight today-    ROS-see HPI   + = positive Constitutional:    weight loss, night sweats, fevers, chills, fatigue, lassitude. HEENT:    headaches, difficulty swallowing, tooth/dental problems, sore throat,       sneezing, itching, ear ache, nasal congestion, post nasal drip, snoring CV:    chest pain, orthopnea, PND, swelling in lower extremities, anasarca,                                   dizziness, palpitations Resp:   shortness of breath with exertion or at rest.                productive cough,   non-productive cough, coughing up of blood.              change in color of mucus.  wheezing.   Skin:    rash or lesions. GI:  No-   heartburn, indigestion, abdominal pain, nausea, vomiting, diarrhea,                 change in bowel habits, loss of appetite GU: dysuria, change in color of urine, no urgency or frequency.   flank pain. MS:   joint pain, stiffness, decreased range of motion, back pain. Neuro-     nothing unusual Psych:  change in mood or affect.  depression or anxiety.   memory loss.  OBJ- Physical Exam General- Alert, Oriented, Affect-appropriate,  Distress- none acute, + overweight Skin- rash-none, lesions- none, excoriation- none Lymphadenopathy- none Head- atraumatic            Eyes- Gross vision intact, PERRLA, conjunctivae and secretions clear            Ears- Hearing, canals-normal            Nose- Clear, no-Septal dev, mucus, polyps, erosion, perforation             Throat- Mallampati IV , mucosa clear , drainage- none, tonsils- atrophic, +teeth Neck- flexible , trachea midline, no stridor , thyroid nl, carotid no bruit Chest - symmetrical excursion , unlabored           Heart/CV- RRR , no murmur , no gallop  , no rub, nl s1 s2                           - JVD- none , edema- none, stasis changes- none, varices- none           Lung- clear to P&A, wheeze- none, cough- none , dullness-none, rub- none           Chest wall-  Abd-  Br/ Gen/ Rectal- Not done, not indicated Extrem- cyanosis- none, clubbing, none, atrophy- none, strength- nl Neuro- grossly intact to observation

## 2022-04-04 ENCOUNTER — Ambulatory Visit: Payer: 59 | Admitting: Internal Medicine

## 2022-04-21 ENCOUNTER — Telehealth: Payer: Self-pay | Admitting: Family

## 2022-04-21 NOTE — Telephone Encounter (Signed)
Pt has switched ins and is needing all current meds switched to a different pharmacy.   [facility] [address] [place], [place] [address]

## 2022-04-25 ENCOUNTER — Other Ambulatory Visit: Payer: Self-pay

## 2022-04-25 DIAGNOSIS — J309 Allergic rhinitis, unspecified: Secondary | ICD-10-CM

## 2022-04-25 DIAGNOSIS — G4733 Obstructive sleep apnea (adult) (pediatric): Secondary | ICD-10-CM | POA: Diagnosis not present

## 2022-04-25 MED ORDER — TAMSULOSIN HCL 0.4 MG PO CAPS
ORAL_CAPSULE | ORAL | 3 refills | Status: DC
Start: 2022-04-25 — End: 2023-01-05

## 2022-04-25 MED ORDER — AMLODIPINE BESYLATE 5 MG PO TABS: ORAL_TABLET | ORAL | 1 refills | Status: DC

## 2022-04-25 MED ORDER — MONTELUKAST SODIUM 10 MG PO TABS: ORAL_TABLET | ORAL | 1 refills | Status: DC

## 2022-04-25 NOTE — Telephone Encounter (Signed)
Prescriptions sent new name [address] [facility]

## 2022-04-26 ENCOUNTER — Encounter: Payer: Self-pay | Admitting: Internal Medicine

## 2022-04-27 NOTE — Progress Notes (Signed)
HPI- M never smoker followed for OSA, complicated by HTN, Allergic Rhinitis, BPH, Hyperlipidemia,  HST 01/14/22- AHI 31.8/ hr, desaturation to 83%, body weight 206 lbs  ======================================================   12/13/21- 65 yoM never smoker for sleep evaluation with concern of witnessed apneas courtesy of Sandford Craze, NP Medical problem list includes HTN, Allergic Rhinitis, BPH, Hyperlipidemia,  Epworth score- Body weight today-206 lbs Covid vax-5 Phizer Flu vax-had -----He was told that he snores and stops breathing, he has trouble with sinus pressure,  Wife has complained for years.  He minimizes daytime sleepiness.  No sleep medicines.  Little caffeine.  Brother may have OSA.  No ENT surgery.  No heart or lung problems recognized. Recent sinus pressure discomfort despite saline rinses and Mucinex.  04/28/22 - 65 yoM never smoker followed for OSA, complicated by HTN, Allergic Rhinitis, BPH, Hyperlipidemia,  HST 01/14/22- AHI 31.8/ hr, desaturation to 83%, body weight 206 lbs CPAP auto 5-20/ [facility]     new ordered 01/26/22 Download compliance- 77%, AHI 3.8/ hr Body weight today- 206 lbs Covid vax- -----Pt f/u on CPAP machine is working well. Getting approx 4.5-5hrs/night, pressure seems like it's okay as long as the mask seal is good.  Download reviewed.  Compliance and comfort goals discussed.  He is satisfied to continue. Noting some postnasal drip which may partly reflect airflow stimulation from his mask.  Simple initial measures discussed.  ROS-see HPI   + = positive Constitutional:    weight loss, night sweats, fevers, chills, fatigue, lassitude. HEENT:    headaches, difficulty swallowing, tooth/dental problems, sore throat,       sneezing, itching, ear ache, nasal congestion, post nasal drip, snoring CV:    chest pain, orthopnea, PND, swelling in lower extremities, anasarca,                                  dizziness, palpitations Resp:   shortness of breath  with exertion or at rest.                productive cough,   non-productive cough, coughing up of blood.              change in color of mucus.  wheezing.   Skin:    rash or lesions. GI:  No-   heartburn, indigestion, abdominal pain, nausea, vomiting, diarrhea,                 change in bowel habits, loss of appetite GU: dysuria, change in color of urine, no urgency or frequency.   flank pain. MS:   joint pain, stiffness, decreased range of motion, back pain. Neuro-     nothing unusual Psych:  change in mood or affect.  depression or anxiety.   memory loss.  OBJ- Physical Exam General- Alert, Oriented, Affect-appropriate, Distress- none acute, + overweight Skin- rash-none, lesions- none, excoriation- none Lymphadenopathy- none Head- atraumatic            Eyes- Gross vision intact, PERRLA, conjunctivae and secretions clear            Ears- Hearing, canals-normal            Nose- Clear, no-Septal dev, mucus, polyps, erosion, perforation             Throat- Mallampati IV , mucosa clear , drainage- none, tonsils- atrophic, +teeth Neck- flexible , trachea midline, no stridor , thyroid nl, carotid no bruit Chest - symmetrical  excursion , unlabored           Heart/CV- RRR , no murmur , no gallop  , no rub, nl s1 s2                           - JVD- none , edema- none, stasis changes- none, varices- none           Lung- clear to P&A, wheeze- none, cough- none , dullness-none, rub- none           Chest wall-  Abd-  Br/ Gen/ Rectal- Not done, not indicated Extrem- cyanosis- none, clubbing, none, atrophy- none, strength- nl Neuro- grossly intact to observation

## 2022-04-28 ENCOUNTER — Ambulatory Visit: Payer: 59 | Admitting: Internal Medicine

## 2022-04-28 ENCOUNTER — Encounter: Payer: Self-pay | Admitting: Internal Medicine

## 2022-04-28 DIAGNOSIS — J309 Allergic rhinitis, unspecified: Secondary | ICD-10-CM | POA: Diagnosis not present

## 2022-04-28 DIAGNOSIS — G4733 Obstructive sleep apnea (adult) (pediatric): Secondary | ICD-10-CM

## 2022-04-28 NOTE — Patient Instructions (Signed)
We can continue CPAP auto 5-20  Consider trial of Nyquil at bedtime to see how that affeccts the nasal drainage concern.  Please call if we can help

## 2022-05-02 ENCOUNTER — Telehealth: Payer: Self-pay | Admitting: Internal Medicine

## 2022-05-02 NOTE — Telephone Encounter (Signed)
Printed last office note for patient and faxed to [facility] with Attn to stacy at fax number provided. Nothing further needed

## 2022-05-18 ENCOUNTER — Ambulatory Visit (INDEPENDENT_AMBULATORY_CARE_PROVIDER_SITE_OTHER): Payer: 59 | Admitting: Family

## 2022-05-18 VITALS — BP 138/67 | HR 73 | Temp 98.3°F | Resp 16 | Ht 67.0 in | Wt 204.0 lb

## 2022-05-18 DIAGNOSIS — R739 Hyperglycemia, unspecified: Secondary | ICD-10-CM | POA: Diagnosis not present

## 2022-05-18 DIAGNOSIS — Z23 Encounter for immunization: Secondary | ICD-10-CM | POA: Diagnosis not present

## 2022-05-18 DIAGNOSIS — N401 Enlarged prostate with lower urinary tract symptoms: Secondary | ICD-10-CM | POA: Diagnosis not present

## 2022-05-18 DIAGNOSIS — J309 Allergic rhinitis, unspecified: Secondary | ICD-10-CM | POA: Diagnosis not present

## 2022-05-18 DIAGNOSIS — I10 Essential (primary) hypertension: Secondary | ICD-10-CM | POA: Diagnosis not present

## 2022-05-18 LAB — BASIC METABOLIC PANEL
BUN: 13 mg/dL (ref 6–23)
CO2: 28 mEq/L (ref 19–32)
Calcium: 8.9 mg/dL (ref 8.4–10.5)
Chloride: 104 mEq/L (ref 96–112)
Creatinine, Ser: 1.13 mg/dL (ref 0.40–1.50)
GFR: 68.98 mL/min (ref >60.00)
Glucose, Bld: 89 mg/dL (ref 70–99)
Potassium: 3.7 mEq/L (ref 3.5–5.1)
Sodium: 140 mEq/L (ref 135–145)

## 2022-05-18 LAB — HEMOGLOBIN A1C: Hgb A1c MFr Bld: 5.7 % (ref 4.6–6.5)

## 2022-05-18 NOTE — Assessment & Plan Note (Signed)
Mild postnasal drip may be increased a little since using CPAP.  Discussed as possible vasomotor rhinitis. Plan-since he notices it mostly at night.  I suggested he try a little NyQuil to see what effect that has.  Discussed saline and Flonase nasal sprays.

## 2022-05-18 NOTE — Assessment & Plan Note (Addendum)
He takes zyrtec and singulair prn with good relief of symptoms.

## 2022-05-18 NOTE — Assessment & Plan Note (Signed)
Stable on flomax. Continue same.  

## 2022-05-18 NOTE — Assessment & Plan Note (Signed)
Good initial started on CPAP.  Benefits with improved sleep. Plan-continue auto 5-20

## 2022-05-18 NOTE — Assessment & Plan Note (Signed)
BP Readings from Last 3 Encounters:  05/18/22 138/67  04/28/22 104/62  03/29/22 119/64   Stable on accupril and amlodipine.

## 2022-05-18 NOTE — Progress Notes (Signed)
Subjective:   By signing my name below, I, Cassell Clement, attest that this documentation has been prepared under the direction and in the presence of Birdie Sons, NP 05/18/2022   Patient ID: Joel Chen, male    DOB: March 23, 1956, 66 y.o.   MRN: [id number]  Chief Complaint  Patient presents with   Hypertension    Here for follow up    HPI Patient is in today for an office visit   Sleep Apnea: He is currently using a Cpap and feels well rested. He also notices that he has been sleeping a bit longer Blood Pressure: As of today's visit, his blood pressure is normal. He is currently taking 5 Mg of Amlodipine BP Readings from Last 3 Encounters:  05/18/22 138/67  04/28/22 104/62  03/29/22 119/64   Pulse Readings from Last 3 Encounters:  05/18/22 73  04/28/22 87  03/29/22 76   Allergies: His allergies are controlled. He takes 10 Mg of Singulair and 10 Mg of Zyrtec PRN Urination: He is currently taking 0.4 Mg of Flomax and reports that he does not use the bathroom until the next morning Blood in Urine: He states that he has seen a urologist for blood in his urine and since then, his symptoms are resolved.   Health Maintenance Due  Topic Date Due   COVID-19 Vaccine (6 - Pfizer series) 10/12/2021    Past Medical History:  Diagnosis Date   Chicken pox    Hyperlipidemia 04/29/2011   Hypertension    Mumps    Personal history of hyperthyroidism    "pt states his hyperthyroidism went away and hasn't had to take meds in 8 yrs."    Past Surgical History:  Procedure Laterality Date   COLONOSCOPY  09/2009   hemorrhoids   ROOT CANAL  1998   WISDOM TOOTH EXTRACTION  1994    Family History  Problem Relation Age of Onset   Heart disease Mother        Living   Hypertension Mother    Cancer Father 53       lung-Deceased   Healthy Sister        x2   Other Brother        Hyperglycemia/Pre-diabetes   Healthy Brother        x1 [#2]   Heart disease Maternal Aunt     Hypertension Maternal Aunt    Dementia Maternal Grandfather    Allergies Daughter    Alpha-1 antitrypsin deficiency Neg Hx    Liver disease Neg Hx     Social History   Socioeconomic History   Marital status: Married    Spouse name: Not on file   Number of children: 2   Years of education: Not on file   Highest education level: Not on file  Occupational History   Occupation: DJ    Comment: self-employed  Tobacco Use   Smoking status: Never    Passive exposure: Yes   Smokeless tobacco: Never  Substance and Sexual Activity   Alcohol use: Yes    Alcohol/week: 6.0 standard drinks of alcohol    Types: 6 Cans of beer per week    Comment: "couple" a week   Drug use: No   Sexual activity: Yes    Partners: Female  Other Topics Concern   Not on file  Social History Narrative   Regular exercise:  Active at work. Walks 2 x weekly.   Caffeine use: 1 cup coffee daily   Building control surveyor  at [employer]    Lost a daughter to MVA   Has another daughter               Social Determinants of Health   Financial Resource Strain: Not on file  Food Insecurity: Not on file  Transportation Needs: Not on file  Physical Activity: Not on file  Stress: Not on file  Social Connections: Not on file  Intimate Partner Violence: Not on file    Outpatient Medications Prior to Visit  Medication Sig Dispense Refill   amLODipine (NORVASC) 5 MG tablet TAKE 1 TABLET(5 MG) BY MOUTH DAILY 90 tablet 1   cetirizine (ZYRTEC) 10 MG tablet Take 1 tablet (10 mg total) by mouth daily. 30 tablet 11   montelukast (SINGULAIR) 10 MG tablet TAKE 1 TABLET(10 MG) BY MOUTH AT BEDTIME 90 tablet 1   tamsulosin (FLOMAX) 0.4 MG CAPS capsule TAKE 1 CAPSULE(0.4 MG) BY MOUTH DAILY 90 capsule 3   No facility-administered medications prior to visit.    No Known Allergies  ROS     Objective:    Physical Exam Constitutional:      General: He is not in acute distress.    Appearance: Normal appearance. He is not  ill-appearing.  HENT:     Head: Normocephalic and atraumatic.     Right Ear: External ear normal.     Left Ear: External ear normal.  Eyes:     Extraocular Movements: Extraocular movements intact.     Pupils: Pupils are equal, round, and reactive to light.  Cardiovascular:     Rate and Rhythm: Normal rate and regular rhythm.     Heart sounds: Normal heart sounds. No murmur heard.    No gallop.  Pulmonary:     Effort: Pulmonary effort is normal. No respiratory distress.     Breath sounds: Normal breath sounds. No wheezing or rales.  Skin:    General: Skin is warm and dry.  Neurological:     Mental Status: He is alert and oriented to person, place, and time.  Psychiatric:        Mood and Affect: Mood normal.        Behavior: Behavior normal.        Judgment: Judgment normal.     BP 138/67 (BP Location: Right Arm, Patient Position: Sitting, Cuff Size: Small)   Pulse 73   Temp 98.3 F (36.8 C) (Oral)   Resp 16   Ht 5\' 7"  (1.702 m)   Wt 204 lb (92.5 kg)   SpO2 100%   BMI 31.95 kg/m  Wt Readings from Last 3 Encounters:  05/18/22 204 lb (92.5 kg)  04/28/22 206 lb 6.4 oz (93.6 kg)  03/29/22 207 lb 3.2 oz (94 kg)       Assessment & Plan:   Problem List Items Addressed This Visit       Unprioritized   Hypertension    BP Readings from Last 3 Encounters:  05/18/22 138/67  04/28/22 104/62  03/29/22 119/64  Stable on accupril and amlodipine.        Relevant Orders   Basic metabolic panel   Benign prostatic hyperplasia with lower urinary tract symptoms    Stable on flomax. Continue same.       Allergic rhinitis    He takes zyrtec and singulair prn with good relief of symptoms.       Other Visit Diagnoses     Needs flu shot    -  Primary   Relevant  Orders   Flu Vaccine QUAD 6+ mos PF IM (Fluarix Quad PF) (Completed)   Hyperglycemia       Relevant Orders   Hemoglobin A1c      No orders of the defined types were placed in this encounter.   I, Lemont Fillers, NP, personally preformed the services described in this documentation.  All medical record entries made by the scribe were at my direction and in my presence.  I have reviewed the chart and discharge instructions (if applicable) and agree that the record reflects my personal performance and is accurate and complete. 05/18/2022   I,Amber Collins,acting as a scribe for Lemont Fillers, NP.,have documented all relevant documentation on the behalf of Lemont Fillers, NP,as directed by  Lemont Fillers, NP while in the presence of Lemont Fillers, NP.    Lemont Fillers, NP

## 2022-05-19 ENCOUNTER — Encounter: Payer: Self-pay | Admitting: Family

## 2022-05-20 NOTE — Progress Notes (Signed)
Mailed out to patient 

## 2022-05-25 ENCOUNTER — Other Ambulatory Visit: Payer: Self-pay

## 2022-05-25 MED ORDER — AMLODIPINE BESYLATE 5 MG PO TABS: ORAL_TABLET | ORAL | 1 refills | Status: DC

## 2022-05-26 DIAGNOSIS — G4733 Obstructive sleep apnea (adult) (pediatric): Secondary | ICD-10-CM | POA: Diagnosis not present

## 2022-06-25 DIAGNOSIS — G4733 Obstructive sleep apnea (adult) (pediatric): Secondary | ICD-10-CM | POA: Diagnosis not present

## 2022-07-26 DIAGNOSIS — G4733 Obstructive sleep apnea (adult) (pediatric): Secondary | ICD-10-CM | POA: Diagnosis not present

## 2022-08-11 ENCOUNTER — Other Ambulatory Visit (HOSPITAL_BASED_OUTPATIENT_CLINIC_OR_DEPARTMENT_OTHER): Payer: Self-pay

## 2022-08-11 MED ORDER — COMIRNATY 30 MCG/0.3ML IM SUSY
PREFILLED_SYRINGE | INTRAMUSCULAR | 0 refills | Status: DC
Start: 2022-08-11 — End: 2023-03-10
  Filled 2022-08-11: qty 0.3, 1d supply, fill #0

## 2022-08-25 DIAGNOSIS — G4733 Obstructive sleep apnea (adult) (pediatric): Secondary | ICD-10-CM | POA: Diagnosis not present

## 2022-09-22 ENCOUNTER — Other Ambulatory Visit: Payer: Self-pay | Admitting: Family

## 2022-09-22 DIAGNOSIS — J309 Allergic rhinitis, unspecified: Secondary | ICD-10-CM

## 2022-09-25 DIAGNOSIS — G4733 Obstructive sleep apnea (adult) (pediatric): Secondary | ICD-10-CM | POA: Diagnosis not present

## 2022-09-26 DIAGNOSIS — J9811 Atelectasis: Secondary | ICD-10-CM | POA: Diagnosis not present

## 2022-09-26 DIAGNOSIS — N6312 Unspecified lump in the right breast, upper inner quadrant: Secondary | ICD-10-CM | POA: Diagnosis not present

## 2022-09-26 DIAGNOSIS — N281 Cyst of kidney, acquired: Secondary | ICD-10-CM | POA: Diagnosis not present

## 2022-09-26 DIAGNOSIS — I700 Atherosclerosis of aorta: Secondary | ICD-10-CM | POA: Diagnosis not present

## 2022-09-26 DIAGNOSIS — I2510 Atherosclerotic heart disease of native coronary artery without angina pectoris: Secondary | ICD-10-CM | POA: Diagnosis not present

## 2022-09-26 DIAGNOSIS — K5730 Diverticulosis of large intestine without perforation or abscess without bleeding: Secondary | ICD-10-CM | POA: Diagnosis not present

## 2022-09-29 DIAGNOSIS — M1711 Unilateral primary osteoarthritis, right knee: Secondary | ICD-10-CM | POA: Diagnosis not present

## 2022-09-29 DIAGNOSIS — C50911 Malignant neoplasm of unspecified site of right female breast: Secondary | ICD-10-CM | POA: Diagnosis not present

## 2022-09-29 DIAGNOSIS — M19071 Primary osteoarthritis, right ankle and foot: Secondary | ICD-10-CM | POA: Diagnosis not present

## 2022-09-29 DIAGNOSIS — M19011 Primary osteoarthritis, right shoulder: Secondary | ICD-10-CM | POA: Diagnosis not present

## 2022-10-26 DIAGNOSIS — G4733 Obstructive sleep apnea (adult) (pediatric): Secondary | ICD-10-CM | POA: Diagnosis not present

## 2022-10-26 NOTE — Progress Notes (Signed)
HPI- M never smoker followed for OSA, complicated by HTN, Allergic Rhinitis, BPH, Hyperlipidemia,  HST 01/14/22- AHI 31.8/ hr, desaturation to 83%, body weight 206 lbs  ======================================================   04/28/22 - 65 yoM never smoker followed for OSA, complicated by HTN, Allergic Rhinitis, BPH, Hyperlipidemia,  HST 01/14/22- AHI 31.8/ hr, desaturation to 83%, body weight 206 lbs CPAP auto 5-20/ [facility]     new ordered 01/26/22 Download compliance- 77%, AHI 3.8/ hr Body weight today- 206 lbs Covid vax- -----Pt f/u on CPAP machine is working well. Getting approx 4.5-5hrs/night, pressure seems like it's okay as long as the mask seal is good.  Download reviewed.  Compliance and comfort goals discussed.  He is satisfied to continue. Noting some postnasal drip which may partly reflect airflow stimulation from his mask.  Simple initial measures discussed.  10/31/22-  66 yoM never smoker followed for OSA, complicated by HTN, Allergic Rhinitis, BPH, Hyperlipidemia,  CPAP auto 5-20/ [facility]     new ordered 01/26/22 Download compliance- 93%, AHI 3.9/ hr Body weight today-202 lbs Covid vax-5 Flu vax had Download reviewed. Comfort issues discussed. He is satisfied with CPAP status. Wife notes he gets rhinorrhea with eating. We discussed this reflex. I noted availability of ipratropium nasal spray, but he chose to ignore it since it is not dangerous.   ROS-see HPI   + = positive Constitutional:    weight loss, night sweats, fevers, chills, fatigue, lassitude. HEENT:    headaches, difficulty swallowing, tooth/dental problems, sore throat,       sneezing, itching, ear ache, nasal congestion, post nasal drip, snoring CV:    chest pain, orthopnea, PND, swelling in lower extremities, anasarca,                                   dizziness, palpitations Resp:   shortness of breath with exertion or at rest.                productive cough,   non-productive cough, coughing up of blood.               change in color of mucus.  wheezing.   Skin:    rash or lesions. GI:  No-   heartburn, indigestion, abdominal pain, nausea, vomiting, diarrhea,                 change in bowel habits, loss of appetite GU: dysuria, change in color of urine, no urgency or frequency.   flank pain. MS:   joint pain, stiffness, decreased range of motion, back pain. Neuro-     nothing unusual Psych:  change in mood or affect.  depression or anxiety.   memory loss.  OBJ- Physical Exam General- Alert, Oriented, Affect-appropriate, Distress- none acute, + overweight Skin- rash-none, lesions- none, excoriation- none Lymphadenopathy- none Head- atraumatic            Eyes- Gross vision intact, PERRLA, conjunctivae and secretions clear            Ears- Hearing, canals-normal            Nose- Clear, no-Septal dev, mucus, polyps, erosion, perforation             Throat- Mallampati IV , mucosa clear , drainage- none, tonsils- atrophic, +teeth Neck- flexible , trachea midline, no stridor , thyroid nl, carotid no bruit Chest - symmetrical excursion , unlabored  Heart/CV- RRR , no murmur , no gallop  , no rub, nl s1 s2                           - JVD- none , edema- none, stasis changes- none, varices- none           Lung- clear to P&A, wheeze- none, cough- none , dullness-none, rub- none           Chest wall-  Abd-  Br/ Gen/ Rectal- Not done, not indicated Extrem- cyanosis- none, clubbing, none, atrophy- none, strength- nl Neuro- grossly intact to observation

## 2022-10-31 ENCOUNTER — Ambulatory Visit: Payer: 59 | Admitting: Internal Medicine

## 2022-10-31 ENCOUNTER — Encounter: Payer: Self-pay | Admitting: Internal Medicine

## 2022-10-31 VITALS — BP 116/62 | HR 78 | Temp 98.1°F | Ht 67.0 in | Wt 202.2 lb

## 2022-10-31 DIAGNOSIS — G4733 Obstructive sleep apnea (adult) (pediatric): Secondary | ICD-10-CM

## 2022-10-31 DIAGNOSIS — J309 Allergic rhinitis, unspecified: Secondary | ICD-10-CM | POA: Diagnosis not present

## 2022-10-31 NOTE — Assessment & Plan Note (Signed)
He is now describing gustatory rhinorrhea with meals. Ipratropium nasal spray usually helps, but he chose to put up with the symptom for now.

## 2022-10-31 NOTE — Patient Instructions (Signed)
We can continue CPAP auto 5-20  Please call if we can help 

## 2022-10-31 NOTE — Assessment & Plan Note (Signed)
Benefits from CPAP with good compliance and control Plan- continue auto 5-20

## 2022-11-17 DIAGNOSIS — J9811 Atelectasis: Secondary | ICD-10-CM | POA: Diagnosis not present

## 2022-11-21 ENCOUNTER — Ambulatory Visit: Payer: 59 | Admitting: Family

## 2022-11-21 DIAGNOSIS — G4733 Obstructive sleep apnea (adult) (pediatric): Secondary | ICD-10-CM | POA: Diagnosis not present

## 2022-11-24 DIAGNOSIS — G4733 Obstructive sleep apnea (adult) (pediatric): Secondary | ICD-10-CM | POA: Diagnosis not present

## 2022-12-01 DIAGNOSIS — G4733 Obstructive sleep apnea (adult) (pediatric): Secondary | ICD-10-CM | POA: Diagnosis not present

## 2022-12-01 DIAGNOSIS — Z6833 Body mass index (BMI) 33.0-33.9, adult: Secondary | ICD-10-CM | POA: Diagnosis not present

## 2022-12-01 DIAGNOSIS — N400 Enlarged prostate without lower urinary tract symptoms: Secondary | ICD-10-CM | POA: Diagnosis not present

## 2022-12-01 DIAGNOSIS — J302 Other seasonal allergic rhinitis: Secondary | ICD-10-CM | POA: Diagnosis not present

## 2022-12-01 DIAGNOSIS — E669 Obesity, unspecified: Secondary | ICD-10-CM | POA: Diagnosis not present

## 2022-12-01 DIAGNOSIS — Z8249 Family history of ischemic heart disease and other diseases of the circulatory system: Secondary | ICD-10-CM | POA: Diagnosis not present

## 2022-12-01 DIAGNOSIS — I10 Essential (primary) hypertension: Secondary | ICD-10-CM | POA: Diagnosis not present

## 2022-12-01 DIAGNOSIS — Z809 Family history of malignant neoplasm, unspecified: Secondary | ICD-10-CM | POA: Diagnosis not present

## 2022-12-19 ENCOUNTER — Other Ambulatory Visit: Payer: Self-pay | Admitting: Family

## 2022-12-19 DIAGNOSIS — J309 Allergic rhinitis, unspecified: Secondary | ICD-10-CM

## 2022-12-29 DIAGNOSIS — I700 Atherosclerosis of aorta: Secondary | ICD-10-CM | POA: Diagnosis not present

## 2023-01-05 ENCOUNTER — Telehealth: Payer: Self-pay | Admitting: Family

## 2023-01-05 NOTE — Telephone Encounter (Signed)
Pt's wife answered and stated he will call back. Stated his mother is in the hospital and they have a lot going on at this moment.

## 2023-01-05 NOTE — Telephone Encounter (Signed)
Please contact pt to schedule a follow up

## 2023-01-15 ENCOUNTER — Other Ambulatory Visit: Payer: Self-pay | Admitting: Family

## 2023-01-15 DIAGNOSIS — J309 Allergic rhinitis, unspecified: Secondary | ICD-10-CM

## 2023-02-28 ENCOUNTER — Ambulatory Visit: Payer: 59 | Admitting: Family

## 2023-03-10 ENCOUNTER — Ambulatory Visit (INDEPENDENT_AMBULATORY_CARE_PROVIDER_SITE_OTHER): Payer: 59 | Admitting: Family

## 2023-03-10 VITALS — BP 128/66 | HR 83 | Temp 98.8°F | Resp 16 | Wt 206.0 lb

## 2023-03-10 DIAGNOSIS — M170 Bilateral primary osteoarthritis of knee: Secondary | ICD-10-CM

## 2023-03-10 DIAGNOSIS — I10 Essential (primary) hypertension: Secondary | ICD-10-CM

## 2023-03-10 DIAGNOSIS — R739 Hyperglycemia, unspecified: Secondary | ICD-10-CM | POA: Diagnosis not present

## 2023-03-10 LAB — HEMOGLOBIN A1C: Hgb A1c MFr Bld: 5.6 % (ref 4.6–6.5)

## 2023-03-10 LAB — BASIC METABOLIC PANEL
BUN: 14 mg/dL (ref 6–23)
CO2: 28 mEq/L (ref 19–32)
Calcium: 9 mg/dL (ref 8.4–10.5)
Chloride: 105 mEq/L (ref 96–112)
Creatinine, Ser: 1.14 mg/dL (ref 0.40–1.50)
GFR: 67.87 mL/min (ref >60.00)
Glucose, Bld: 92 mg/dL (ref 70–99)
Potassium: 4.2 mEq/L (ref 3.5–5.1)
Sodium: 141 mEq/L (ref 135–145)

## 2023-03-10 MED ORDER — DICLOFENAC SODIUM 1 % EX GEL: 4.0000 g | Freq: Two times a day (BID) | CUTANEOUS | Status: DC

## 2023-03-10 NOTE — Progress Notes (Signed)
Subjective:     Patient ID: Joel Chen, male    DOB: 19-Apr-1956, 67 y.o.   MRN: [id number]  Chief Complaint  Patient presents with   Hypertension    Here for follow up   Knee Pain    Complains of left knee pain    Hypertension  Knee Pain     Discussed the use of AI scribe software for clinical note transcription with the patient, who gave verbal consent to proceed.  History of Present Illness   The patient, with a history of hypertension managed with amlodipine and quinapril, presents with new left knee pain. The pain, described as sharp, is located behind the kneecap and radiates to the posterior aspect of the knee. The pain is worse with standing and as the day progresses. The patient reports a family history of similar knee pain. The patient denies pain in the right knee, but did initially experience bilateral knee pain. The left knee feels swollen and tight. The patient also reports intermittent shoulder pain, managed with over-the-counter Tylenol.     BP Readings from Last 3 Encounters:  03/10/23 128/66  10/31/22 116/62  05/18/22 138/67   Lab Results  Component Value Date   CHOL 193 11/15/2021   HDL 62.90 11/15/2021   LDLCALC 115 (H) 11/15/2021   LDLDIRECT 124.0 10/16/2020   TRIG 77.0 11/15/2021   CHOLHDL 3 11/15/2021        There are no preventive care reminders to display for this patient.  Past Medical History:  Diagnosis Date   Chicken pox    Hyperlipidemia 04/29/2011   Hypertension    Mumps    Personal history of hyperthyroidism    "pt states his hyperthyroidism went away and hasn't had to take meds in 8 yrs."    Past Surgical History:  Procedure Laterality Date   COLONOSCOPY  09/2009   hemorrhoids   ROOT CANAL  1998   WISDOM TOOTH EXTRACTION  1994    Family History  Problem Relation Age of Onset   Heart disease Mother        Living   Hypertension Mother    Cancer Father 52       lung-Deceased   Healthy Sister        x2   Other  Brother        Hyperglycemia/Pre-diabetes   Healthy Brother        x1 [#2]   Heart disease Maternal Aunt    Hypertension Maternal Aunt    Dementia Maternal Grandfather    Allergies Daughter    Alpha-1 antitrypsin deficiency Neg Hx    Liver disease Neg Hx     Social History   Socioeconomic History   Marital status: Married    Spouse name: Not on file   Number of children: 2   Years of education: Not on file   Highest education level: Not on file  Occupational History   Occupation: DJ    Comment: self-employed  Tobacco Use   Smoking status: Never    Passive exposure: Yes   Smokeless tobacco: Never  Substance and Sexual Activity   Alcohol use: Yes    Alcohol/week: 6.0 standard drinks of alcohol    Types: 6 Cans of beer per week    Comment: "couple" a week   Drug use: No   Sexual activity: Yes    Partners: Female  Other Topics Concern   Not on file  Social History Narrative   Regular exercise:  Active at  work. [employer] 2 x weekly.   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 90 tablet 1   cetirizine (ZYRTEC) 10 MG tablet Take 1 tablet (10 mg total) by mouth daily. 30 tablet 11   montelukast (SINGULAIR) 10 MG tablet TAKE 1 TABLET BY MOUTH EVERY DAY 90 tablet 1   Omega-3 Fatty Acids (FISH OIL) 1000 MG CAPS as directed Orally     tamsulosin (FLOMAX) 0.4 MG CAPS capsule TAKE 1 BY MOUTH DAILY 30 capsule 5   COVID-19 mRNA vaccine 2023-2024 (COMIRNATY) syringe Inject into  the muscle. 0.3 mL 0   No facility-administered medications prior to visit.    No Known Allergies  ROS See HPI     Objective:    Physical Exam Constitutional:      General: He is not in acute distress.    Appearance: He is well-developed.  HENT:     Head: Normocephalic and atraumatic.  Cardiovascular:     Rate and Rhythm: Normal rate and regular rhythm.     Heart sounds: No murmur heard. Pulmonary:     Effort: Pulmonary effort is normal. No respiratory distress.     Breath sounds: Normal breath sounds. No wheezing or rales.  Musculoskeletal:     Comments: Mild swelling or left knee, full ROM left knee R knee no swelling noted  Skin:    General: Skin is warm and dry.  Neurological:     Mental Status: He is alert and oriented to person, place, and time.  Psychiatric:        Behavior: Behavior normal.        Thought Content: Thought content normal.   Well controlled on current regimen of Amlodipine and Quinapril. -Continue Amlodipine and Quinapril at current doses.   BP 128/66 (BP Location: Right Arm, Patient Position: Sitting, Cuff Size: Small)   Pulse 83   Temp 98.8 F (37.1 C) (Oral)   Resp 16   Wt 206 lb (93.4 kg)   SpO2 100%   BMI 32.26 kg/m  Wt Readings from Last 3 Encounters:  03/10/23 206 lb (93.4 kg)  10/31/22 202 lb 3.2 oz (91.7 kg)  05/18/22 204 lb (92.5 kg)       Assessment & Plan:   Problem List Items Addressed This Visit       Unprioritized   Primary osteoarthritis of both knees - Primary    New onset, likely osteoarthritis given the location, family history, and absence of red flag symptoms. Mild swelling noted on exam. -Start over-the-counter Tylenol as needed for pain. -Start over-the-counter Voltaren gel twice daily as needed for pain. -If symptoms persist or worsen, consider referral to orthopedics for further evaluation and possible knee injection.       Hypertension    Well controlled on current regimen of Amlodipine and  Quinapril. -Continue Amlodipine and Quinapril at current doses.  Relevant Orders   Basic Metabolic Panel (BMET)   Other Visit Diagnoses     Hyperglycemia       Relevant Orders   HgB A1c       I have discontinued Lester Kinsman Platas's Comirnaty. I am also having him start on diclofenac Sodium. Additionally, I am having him maintain his cetirizine, Fish Oil, tamsulosin, montelukast, and amLODipine.  Meds ordered this encounter  Medications   diclofenac Sodium (VOLTAREN ARTHRITIS PAIN) 1 % GEL    Sig: Apply 4 g topically in the morning and at bedtime.    Order Specific Question:   Supervising Provider    Answer:   Danise Edge A [[id number]]

## 2023-03-10 NOTE — Assessment & Plan Note (Signed)
Well controlled on current regimen of Amlodipine and Quinapril. -Continue Amlodipine and Quinapril at current doses.

## 2023-03-10 NOTE — Assessment & Plan Note (Signed)
New onset, likely osteoarthritis given the location, family history, and absence of red flag symptoms. Mild swelling noted on exam. -Start over-the-counter Tylenol as needed for pain. -Start over-the-counter Voltaren gel twice daily as needed for pain. -If symptoms persist or worsen, consider referral to orthopedics for further evaluation and possible knee injection.

## 2023-03-10 NOTE — Patient Instructions (Signed)
  During your visit, we discussed your hypertension, new onset of left knee pain, and intermittent shoulder pain. Your blood pressure is well controlled with your current medications. Your knee pain is likely due to osteoarthritis, a condition where the protective cartilage that cushions the ends of your bones wears down over time. Your shoulder pain appears to be related to your muscles and bones.  YOUR PLAN:  -HYPERTENSION: Your blood pressure is well controlled with your current medications, Amlodipine and Quinapril. Please continue taking these at the current doses.  -LEFT KNEE PAIN: Your knee pain is likely due to osteoarthritis. You can use over-the-counter Tylenol and Voltaren gel for pain relief. If your symptoms persist or worsen, we may need to refer you to an orthopedic specialist for further evaluation and possible knee injection.  -INTERMITTENT SHOULDER PAIN: Your shoulder pain appears to be related to your muscles and bones. You can continue using over-the-counter Tylenol for pain relief.  INSTRUCTIONS:  Please continue taking your blood pressure medications as prescribed. For your knee and shoulder pain, you can use over-the-counter Tylenol and Voltaren gel as needed. If your knee pain worsens or does not improve, please contact our office for a possible referral to an orthopedic specialist.

## 2023-03-27 ENCOUNTER — Telehealth: Payer: Self-pay | Admitting: Family

## 2023-03-27 DIAGNOSIS — M25569 Pain in unspecified knee: Secondary | ICD-10-CM

## 2023-03-27 NOTE — Addendum Note (Signed)
Addended by: Sandford Craze on: 03/27/2023 01:47 PM   Modules accepted: Orders

## 2023-03-27 NOTE — Telephone Encounter (Signed)
Patient wife called and states patients pain has not gotten better and is needing the referral for knee to be placed

## 2023-04-11 ENCOUNTER — Other Ambulatory Visit (INDEPENDENT_AMBULATORY_CARE_PROVIDER_SITE_OTHER): Payer: 59

## 2023-04-11 ENCOUNTER — Ambulatory Visit: Payer: 59 | Admitting: Orthopaedic Surgery

## 2023-04-11 DIAGNOSIS — G8929 Other chronic pain: Secondary | ICD-10-CM

## 2023-04-11 DIAGNOSIS — M25562 Pain in left knee: Secondary | ICD-10-CM

## 2023-04-11 MED ORDER — METHYLPREDNISOLONE ACETATE 40 MG/ML IJ SUSP
40.0000 mg | INTRAMUSCULAR | Status: AC | PRN
Start: 2023-04-11 — End: 2023-04-11
  Administered 2023-04-11: 40 mg via INTRA_ARTICULAR

## 2023-04-11 MED ORDER — LIDOCAINE HCL 1 % IJ SOLN
2.0000 mL | INTRAMUSCULAR | Status: AC | PRN
Start: 2023-04-11 — End: 2023-04-11
  Administered 2023-04-11: 2 mL

## 2023-04-11 MED ORDER — BUPIVACAINE HCL 0.5 % IJ SOLN
2.0000 mL | INTRAMUSCULAR | Status: AC | PRN
Start: 2023-04-11 — End: 2023-04-11
  Administered 2023-04-11: 2 mL via INTRA_ARTICULAR

## 2023-04-11 NOTE — Progress Notes (Signed)
Office Visit Note   Patient: Joel Chen           Date of Birth: [birth year]           MRN: [id number] Visit Date: 04/11/2023              Requested by: Sandford Craze, NP [address] [place],  [place] [address] PCP: Sandford Craze, NP   Assessment & Plan: Visit Diagnoses:  1. Chronic pain of left knee     Plan: Impression 67 year old gentleman with left knee pain probable osteoarthritis flare.  Treatment options explained and patient elected to have the knee aspirated and injected with cortisone which he tolerated well.  20 cc of synovial fluid was aspirated and sent to the lab.  Will see him back if his symptoms recur.  Follow-Up Instructions: No follow-ups on file.   Orders:  Orders Placed This Encounter  Procedures   XR KNEE 3 VIEW LEFT   No orders of the defined types were placed in this encounter.     Procedures: Large Joint Inj: L knee on 04/11/2023 8:20 PM Details: 22 G needle Medications: 2 mL bupivacaine 0.5 %; 2 mL lidocaine 1 %; 40 mg methylPREDNISolone acetate 40 MG/ML Aspirate: 20 mL; sent for lab analysis Outcome: tolerated well, no immediate complications Patient was prepped and draped in the usual sterile fashion.       Clinical Data: No additional findings.   Subjective: Chief Complaint  Patient presents with   Left Knee - Pain    HPI Joel Chen is a very pleasant 67 year old gentleman who have seen in the past for olecranon bursitis who comes in for evaluation of a separate problem of left knee pain for 2 months.  Denies any injuries.  He does recall his knee hurting after he was working on a ladder and since then he has felt that it was swollen.  Denies any mechanical symptoms.  The swelling improves overnight but gets worse throughout the day.  He works as a Geologist, engineering. Review of Systems  Constitutional: Negative.   HENT: Negative.    Eyes: Negative.   Respiratory: Negative.    Cardiovascular: Negative.    Gastrointestinal: Negative.   Endocrine: Negative.   Genitourinary: Negative.   Skin: Negative.   Allergic/Immunologic: Negative.   Neurological: Negative.   Hematological: Negative.   Psychiatric/Behavioral: Negative.    All other systems reviewed and are negative.    Objective: Vital Signs: There were no vitals taken for this visit.  Physical Exam Vitals and nursing note reviewed.  Constitutional:      Appearance: He is well-developed.  HENT:     Head: Normocephalic and atraumatic.  Eyes:     Pupils: Pupils are equal, round, and reactive to light.  Pulmonary:     Effort: Pulmonary effort is normal.  Abdominal:     Palpations: Abdomen is soft.  Musculoskeletal:        General: Normal range of motion.     Cervical back: Neck supple.  Skin:    General: Skin is warm.  Neurological:     Mental Status: He is alert and oriented to person, place, and time.  Psychiatric:        Behavior: Behavior normal.        Thought Content: Thought content normal.        Judgment: Judgment normal.     Ortho Exam Examination of the left knee shows joint effusion.  No joint line tenderness.  Range  of motion is slightly limited due to the effusion. Specialty Comments:  No specialty comments available.  Imaging: No results found.   PMFS History: Patient Active Problem List   Diagnosis Date Noted   Primary osteoarthritis of both knees 03/10/2023   Olecranon bursitis of left elbow 12/31/2021   Chronic pain of left knee 11/15/2021   Hematuria 08/17/2021   Allergic rhinitis 08/17/2021   Benign prostatic hyperplasia with lower urinary tract symptoms 07/09/2016   OSA (obstructive sleep apnea) 07/06/2015   Preventative health care 11/17/2013   Hyperlipidemia 04/29/2011   Hypertension 04/08/2011   Personal history of hyperthyroidism 04/08/2011   Past Medical History:  Diagnosis Date   Chicken pox    Hyperlipidemia 04/29/2011   Hypertension    Mumps    Personal history of  hyperthyroidism    "pt states his hyperthyroidism went away and hasn't had to take meds in 8 yrs."    Family History  Problem Relation Age of Onset   Heart disease Mother        Living   Hypertension Mother    Cancer Father 70       lung-Deceased   Healthy Sister        x2   Other Brother        Hyperglycemia/Pre-diabetes   Healthy Brother        x1 [#2]   Heart disease Maternal Aunt    Hypertension Maternal Aunt    Dementia Maternal Grandfather    Allergies Daughter    Alpha-1 antitrypsin deficiency Neg Hx    Liver disease Neg Hx     Past Surgical History:  Procedure Laterality Date   COLONOSCOPY  09/2009   hemorrhoids   ROOT CANAL  1998   WISDOM TOOTH EXTRACTION  1994   Social History   Occupational History   Occupation: DJ    Comment: self-employed  Tobacco Use   Smoking status: Never    Passive exposure: Yes   Smokeless tobacco: Never  Substance and Sexual Activity   Alcohol use: Yes    Alcohol/week: 6.0 standard drinks of alcohol    Types: 6 Cans of beer per week    Comment: "couple" a week   Drug use: No   Sexual activity: Yes    Partners: Female

## 2023-06-23 ENCOUNTER — Encounter: Payer: Self-pay | Admitting: Orthopaedic Surgery

## 2023-06-23 ENCOUNTER — Ambulatory Visit (INDEPENDENT_AMBULATORY_CARE_PROVIDER_SITE_OTHER): Payer: HMO | Admitting: Orthopaedic Surgery

## 2023-06-23 ENCOUNTER — Telehealth: Payer: Self-pay

## 2023-06-23 DIAGNOSIS — M25562 Pain in left knee: Secondary | ICD-10-CM

## 2023-06-23 DIAGNOSIS — G8929 Other chronic pain: Secondary | ICD-10-CM

## 2023-06-23 NOTE — Progress Notes (Signed)
Office Visit Note   Patient: Joel Chen           Date of Birth: [birth year]           MRN: [id number] Visit Date: 06/23/2023              Requested by: Sandford Craze, NP [id number] [place],  [place] [address] PCP: Sandford Craze, NP   Assessment & Plan: Visit Diagnoses:  1. Chronic pain of left knee     Plan: Impression is left knee osteoarthritis.  Today, we discussed various treatment options to include viscosupplementation injection.  Will get approval from insurance and call the patient once approved.  Call with concerns or questions.  This patient is diagnosed with osteoarthritis of the knee(s).    Radiographs show evidence of joint space narrowing, osteophytes, subchondral sclerosis and/or subchondral cysts.  This patient has knee pain which interferes with functional and activities of daily living.    This patient has experienced inadequate response, adverse effects and/or intolerance with conservative treatments such as acetaminophen, NSAIDS, topical creams, physical therapy or regular exercise, knee bracing and/or weight loss.   This patient has experienced inadequate response or has a contraindication to intra articular steroid injections for at least 3 months.   This patient is not scheduled to have a total knee replacement within 6 months of starting treatment with viscosupplementation.   Follow-Up Instructions: Return for f/u once approved for visco.   Orders:  No orders of the defined types were placed in this encounter.  No orders of the defined types were placed in this encounter.     Procedures: No procedures performed   Clinical Data: No additional findings.   Subjective: Chief Complaint  Patient presents with   Left Knee - Pain    HPI patient is a pleasant 67 year old gentleman who comes in today with recurrent left knee pain.  Symptoms began about 8 months ago.  He denies having any injury or change in activity  but notes he is pretty active and works as a Geologist, engineering.  The pain he has is throughout the entire knee.  Worse throughout the day with increased activity.  He denies any mechanical symptoms.  He has tried Aleve without any relief.  He was seen by [place] at the end of July of this year where cortisone injection was performed.  He did have relief for about a week.  Symptoms have gradually returned.  No previous viscosupplementation injection.  Review of Systems as detailed in HPI.  All others reviewed and are negative.   Objective: Vital Signs: There were no vitals taken for this visit.  Physical Exam well-developed well-nourished gentleman in no acute distress.  Alert and oriented x 3.  Ortho Exam left knee exam: Small effusion.  Range of motion 0 to 125 degrees.  No joint line tenderness.  No patellofemoral crepitus.  Ligaments are stable.  He is neurovascular intact distally.  Specialty Comments:  No specialty comments available.  Imaging: No new imaging   PMFS History: Patient Active Problem List   Diagnosis Date Noted   Primary osteoarthritis of both knees 03/10/2023   Olecranon bursitis of left elbow 12/31/2021   Chronic pain of left knee 11/15/2021   Hematuria 08/17/2021   Allergic rhinitis 08/17/2021   Benign prostatic hyperplasia with lower urinary tract symptoms 07/09/2016   OSA (obstructive sleep apnea) 07/06/2015   Preventative health care 11/17/2013   Hyperlipidemia 04/29/2011   Hypertension 04/08/2011   Personal  history of hyperthyroidism 04/08/2011   Past Medical History:  Diagnosis Date   Chicken pox    Hyperlipidemia 04/29/2011   Hypertension    Mumps    Personal history of hyperthyroidism    "pt states his hyperthyroidism went away and hasn't had to take meds in 8 yrs."    Family History  Problem Relation Age of Onset   Heart disease Mother        Living   Hypertension Mother    Cancer Father 60       lung-Deceased   Healthy Sister        x2   Other Brother         Hyperglycemia/Pre-diabetes   Healthy Brother        x1 [#2]   Heart disease Maternal Aunt    Hypertension Maternal Aunt    Dementia Maternal Grandfather    Allergies Daughter    Alpha-1 antitrypsin deficiency Neg Hx    Liver disease Neg Hx     Past Surgical History:  Procedure Laterality Date   COLONOSCOPY  09/2009   hemorrhoids   ROOT CANAL  1998   WISDOM TOOTH EXTRACTION  1994   Social History   Occupational History   Occupation: DJ    Comment: self-employed  Tobacco Use   Smoking status: Never    Passive exposure: Yes   Smokeless tobacco: Never  Substance and Sexual Activity   Alcohol use: Yes    Alcohol/week: 6.0 standard drinks of alcohol    Types: 6 Cans of beer per week    Comment: "couple" a week   Drug use: No   Sexual activity: Yes    Partners: Female

## 2023-06-23 NOTE — Telephone Encounter (Signed)
Please precert for left knee visco injections. Dr.Xu's patient.

## 2023-06-28 NOTE — Telephone Encounter (Signed)
VOB submitted for Monovisc, left knee 

## 2023-07-11 ENCOUNTER — Other Ambulatory Visit: Payer: Self-pay | Admitting: Family

## 2023-07-11 DIAGNOSIS — J309 Allergic rhinitis, unspecified: Secondary | ICD-10-CM

## 2023-07-19 ENCOUNTER — Other Ambulatory Visit (HOSPITAL_BASED_OUTPATIENT_CLINIC_OR_DEPARTMENT_OTHER): Payer: Self-pay

## 2023-07-19 MED ORDER — INFLUENZA VIRUS VACC SPLIT PF (FLUZONE) 0.5 ML IM SUSY
0.5000 mL | PREFILLED_SYRINGE | Freq: Once | INTRAMUSCULAR | 0 refills | Status: AC
  Filled 2023-07-19: qty 0.5, 1d supply, fill #0

## 2023-08-25 ENCOUNTER — Other Ambulatory Visit (HOSPITAL_BASED_OUTPATIENT_CLINIC_OR_DEPARTMENT_OTHER): Payer: Self-pay

## 2023-08-25 MED ORDER — COMIRNATY 30 MCG/0.3ML IM SUSY
0.3000 mL | PREFILLED_SYRINGE | Freq: Once | INTRAMUSCULAR | 0 refills | Status: AC
  Filled 2023-08-25: qty 0.3, 1d supply, fill #0

## 2023-09-11 ENCOUNTER — Encounter: Payer: 59 | Admitting: Family

## 2023-09-20 ENCOUNTER — Ambulatory Visit: Payer: HMO | Admitting: Family

## 2023-09-20 ENCOUNTER — Encounter: Payer: Self-pay | Admitting: Family

## 2023-09-20 VITALS — BP 136/70 | HR 84 | Temp 98.4°F | Resp 16 | Ht 67.0 in | Wt 204.0 lb

## 2023-09-20 DIAGNOSIS — I10 Essential (primary) hypertension: Secondary | ICD-10-CM

## 2023-09-20 DIAGNOSIS — Z23 Encounter for immunization: Secondary | ICD-10-CM

## 2023-09-20 DIAGNOSIS — R319 Hematuria, unspecified: Secondary | ICD-10-CM | POA: Diagnosis not present

## 2023-09-20 DIAGNOSIS — G4733 Obstructive sleep apnea (adult) (pediatric): Secondary | ICD-10-CM | POA: Diagnosis not present

## 2023-09-20 DIAGNOSIS — Z125 Encounter for screening for malignant neoplasm of prostate: Secondary | ICD-10-CM

## 2023-09-20 DIAGNOSIS — E785 Hyperlipidemia, unspecified: Secondary | ICD-10-CM | POA: Diagnosis not present

## 2023-09-20 DIAGNOSIS — Z0000 Encounter for general adult medical examination without abnormal findings: Secondary | ICD-10-CM | POA: Diagnosis not present

## 2023-09-20 DIAGNOSIS — R739 Hyperglycemia, unspecified: Secondary | ICD-10-CM | POA: Diagnosis not present

## 2023-09-20 DIAGNOSIS — J309 Allergic rhinitis, unspecified: Secondary | ICD-10-CM | POA: Diagnosis not present

## 2023-09-20 LAB — LIPID PANEL
Cholesterol: 216 mg/dL — ABNORMAL HIGH (ref 0–200)
HDL: 57.7 mg/dL (ref >39.00)
LDL Cholesterol: 135 mg/dL — ABNORMAL HIGH (ref 0–99)
NonHDL: 157.99
Total CHOL/HDL Ratio: 4
Triglycerides: 113 mg/dL (ref 0.0–149.0)
VLDL: 22.6 mg/dL (ref 0.0–40.0)

## 2023-09-20 LAB — COMPREHENSIVE METABOLIC PANEL
ALT: 37 U/L (ref 0–53)
AST: 26 U/L (ref 0–37)
Albumin: 4.5 g/dL (ref 3.5–5.2)
Alkaline Phosphatase: 100 U/L (ref 39–117)
BUN: 17 mg/dL (ref 6–23)
CO2: 28 meq/L (ref 19–32)
Calcium: 9.2 mg/dL (ref 8.4–10.5)
Chloride: 106 meq/L (ref 96–112)
Creatinine, Ser: 0.99 mg/dL (ref 0.40–1.50)
GFR: 80.09 mL/min (ref >60.00)
Glucose, Bld: 99 mg/dL (ref 70–99)
Potassium: 4.1 meq/L (ref 3.5–5.1)
Sodium: 142 meq/L (ref 135–145)
Total Bilirubin: 0.7 mg/dL (ref 0.2–1.2)
Total Protein: 7 g/dL (ref 6.0–8.3)

## 2023-09-20 LAB — HEMOGLOBIN A1C: Hgb A1c MFr Bld: 6 % (ref 4.6–6.5)

## 2023-09-20 LAB — PSA: PSA: 4.89 ng/mL — ABNORMAL HIGH (ref 0.10–4.00)

## 2023-09-20 NOTE — Assessment & Plan Note (Addendum)
 Lab Results  Component Value Date   CHOL 193 11/15/2021   HDL 62.90 11/15/2021   LDLCALC 115 (H) 11/15/2021   LDLDIRECT 124.0 10/16/2020   TRIG 77.0 11/15/2021   CHOLHDL 3 11/15/2021   Maintained on fish oil. Update lipid panel.

## 2023-09-20 NOTE — Assessment & Plan Note (Signed)
 It looks like he never saw urology but urinalysis performed back in 2022 following episode was negative for microscopic hematuria.

## 2023-09-20 NOTE — Assessment & Plan Note (Signed)
-  Administer prevnar -Check PSA as part of annual physical.  -Encouraged regular eye and dental exams.  -Encourage increased physical activity. -continue healthy diet/exercise efforts. -colo up to date
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
## 2023-09-20 NOTE — Assessment & Plan Note (Addendum)
 BP Readings from Last 3 Encounters:  09/20/23 136/70  03/10/23 128/66  10/31/22 116/62   Maintained on amlodipine 5mg . BP at goal. No changes.

## 2023-09-20 NOTE — Patient Instructions (Signed)
 VISIT SUMMARY:  Today, we discussed your ongoing issue with persistent clear mucus in your head, which has been waking you up at night. We also reviewed your general health and administered a pneumonia shot booster.  YOUR PLAN:  -CHRONIC ALLERGIC RHINITIS: this is condition where the sinuses become inflamed and produce mucus over a long period. You should continue using Singulair , Zyrtec , Flonase , and Navage as you have been. Use Flonase  after Navage to allow it to work more effectively. We may consider allergy testing and possible allergy shots in the future.  -GENERAL HEALTH MAINTENANCE: We administered a pneumonia shot booster today. We will check your PSA levels as part of your annual physical. Please continue with regular eye and dental exams and try to increase your physical activity. Please continue to work on healthy diet, exercise and weight loss.   INSTRUCTIONS:  Please follow up in 6 months.

## 2023-09-20 NOTE — Progress Notes (Signed)
 Subjective:     Patient ID: Joel Chen, male    DOB: [birth year], 68 y.o.   MRN: [id number]  Chief Complaint  Patient presents with   Annual Exam    HPI  Discussed the use of AI scribe software for clinical note transcription with the patient, who gave verbal consent to proceed.  History of Present Illness   Joel Chen, a patient with a history of sleep apnea managed with CPAP, presents for an annual physical. His primary concern is persistent clear mucus in his head that wakes him up twice a night. He describes the mucus as watery and states that it does not seem to originate from his chest. He has been managing the symptoms with Singulair , Zyrtec , Flonase , and Navaj, which he uses before bed and sometimes again during the night. Despite these measures, the issue persists. He denies any pain in the sinus area and states that the issue is not constant but comes and goes. He also mentions a past issue with his knee that was causing him discomfort. However, this issue seems to have resolved itself without intervention.     Immunizations: due for prevnar.  Diet:  healthy Wt Readings from Last 3 Encounters:  09/20/23 204 lb (92.5 kg)  03/10/23 206 lb (93.4 kg)  10/31/22 202 lb 3.2 oz (91.7 kg)  Exercise: walks the dog, feels he needs to do more Colonoscopy: 4/21 Vision:  due will schedule Dental:  due will schedule PSA:  Lab Results  Component Value Date   PSA 2.93 11/15/2021   PSA 3.10 10/16/2020   PSA 2.91 08/15/2018        Health Maintenance Due  Topic Date Due   Medicare Annual Wellness (AWV)  Never done    Past Medical History:  Diagnosis Date   Chicken pox    Hyperlipidemia 04/29/2011   Hypertension    Mumps    Personal history of hyperthyroidism    pt states his hyperthyroidism went away and hasn't had to take meds in 8 yrs.    Past Surgical History:  Procedure Laterality Date   COLONOSCOPY  09/2009   hemorrhoids   ROOT CANAL  1998   WISDOM TOOTH  EXTRACTION  1994    Family History  Problem Relation Age of Onset   Heart disease Mother        Living   Hypertension Mother    Cancer Father 3       lung-Deceased   Healthy Sister        x2   Other Brother        Hyperglycemia/Pre-diabetes   Healthy Brother        x1 [#2]   Heart disease Maternal Aunt    Hypertension Maternal Aunt    Dementia Maternal Grandfather    Allergies Daughter    Alpha-1 antitrypsin deficiency Neg Hx    Liver disease Neg Hx     Social History   Socioeconomic History   Marital status: Married    Spouse name: Not on file   Number of children: 2   Years of education: Not on file   Highest education level: Not on file  Occupational History   Occupation: DJ    Comment: self-employed  Tobacco Use   Smoking status: Never    Passive exposure: Yes   Smokeless tobacco: Never  Substance and Sexual Activity   Alcohol use: Yes    Alcohol/week: 6.0 standard drinks of alcohol    Types: 6 Cans of beer per  week    Comment: couple a week   Drug use: No   Sexual activity: Yes    Partners: Female  Other Topics Concern   Not on file  Social History Narrative   Regular exercise:  Active at work. Walks 2 x weekly.   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 30 tablet 2   cetirizine  (ZYRTEC ) 10 MG tablet Take 1 tablet (10 mg total) by mouth daily. 30 tablet 11   diclofenac  Sodium (VOLTAREN  ARTHRITIS PAIN) 1 % GEL Apply 4 g topically in the morning  and at bedtime.     montelukast  (SINGULAIR ) 10 MG tablet TAKE 1 TABLET BY MOUTH EVERY DAY 30 tablet 2   Omega-3 Fatty Acids (FISH OIL) 1000 MG CAPS as directed Orally     tamsulosin  (FLOMAX ) 0.4 MG CAPS capsule TAKE 1 CAPSULE BY MOUTH EVERY DAY 30 capsule 2   No facility-administered medications prior to visit.    No Known Allergies  Review of Systems  Constitutional:  Negative for weight loss.  HENT:  Positive for congestion. Negative for hearing loss.   Eyes:  Negative for blurred vision.  Respiratory:  Negative for cough and shortness of breath.   Cardiovascular:  Negative for chest pain.  Gastrointestinal:  Negative for constipation and diarrhea.  Genitourinary:  Negative for dysuria and frequency.  Musculoskeletal:  Negative for joint pain and myalgias.  Neurological:  Negative for headaches.  Psychiatric/Behavioral:         Denies depression/anxiety       Objective:    Physical Exam   BP 136/70   Pulse 84   Temp 98.4 F (36.9 C) (Oral)   Resp 16   Ht 5' 7 (1.702 m)   Wt 204 lb (92.5 kg)   SpO2 100%   BMI 31.95 kg/m  Wt Readings from Last 3 Encounters:  09/20/23 204 lb (92.5 kg)  03/10/23 206 lb (93.4 kg)  10/31/22 202 lb 3.2 oz (91.7 kg)   Physical Exam  Constitutional: He is oriented to person, place, and time. He appears well-developed and well-nourished. No distress.  HENT:  Head: Normocephalic and atraumatic.  Right Ear: Tympanic membrane and ear canal normal.  Left Ear: Tympanic membrane and ear canal normal.  Mouth/Throat: Oropharynx is clear and moist.  Eyes: Pupils are equal, round, and reactive to light. No scleral icterus.  Neck: Normal range of motion. No thyromegaly present.  Cardiovascular: Normal rate and regular rhythm.   No murmur heard. Pulmonary/Chest: Effort normal and breath sounds normal. No respiratory distress. He has no wheezes. He has no rales. He exhibits no tenderness.  Abdominal: Soft. Bowel sounds are normal. He exhibits no  distension and no mass. There is no tenderness. There is no rebound and no guarding.  Musculoskeletal: He exhibits no edema.  Lymphadenopathy:    He has no cervical adenopathy.  Neurological: He is alert and oriented to person, place, and time. He has normal patellar reflexes. He exhibits normal muscle tone. Coordination normal.  Skin: Skin is warm and dry.  Psychiatric: He has a normal mood and affect. His behavior is normal. Judgment and thought content normal.           Assessment & Plan:       Assessment & Plan:   Problem List Items Addressed This Visit       Unprioritized   Preventative health care - Primary    -Administer prevnar -Check PSA as part of annual physical.  -Encouraged regular eye and dental exams.  -Encourage increased physical activity. -continue healthy diet/exercise efforts. -colo up to date       OSA (obstructive sleep apnea)   Stable on cpap which is monitored by Dr Neysa .      Hypertension   BP Readings from Last 3 Encounters:  09/20/23 136/70  03/10/23 128/66  10/31/22 116/62   Maintained on amlodipine  5mg . BP at goal. No changes.       Hyperlipidemia   Lab Results  Component Value Date   CHOL 193 11/15/2021   HDL 62.90 11/15/2021   LDLCALC 115 (H) 11/15/2021   LDLDIRECT 124.0 10/16/2020   TRIG 77.0 11/15/2021   CHOLHDL 3 11/15/2021   Maintained on fish oil. Update lipid panel.       Relevant Orders   Lipid panel   Comp Met (CMET)   RESOLVED: Hematuria   It looks like he never saw urology but urinalysis performed back in 2022 following episode was negative for microscopic hematuria.       Allergic rhinitis    Persistent clear mucus production, particularly at night. Currently using Singulair , Zyrtec , Flonase , and Navage. No sinus pain. -Continue current regimen.  -Use Flonase  after Navage to allow it to sit and work more effectively.  -Consider allergy testing and possible allergy shots.       Other Visit Diagnoses        Screening for prostate cancer       Relevant Orders   PSA     Hyperglycemia       Relevant Orders   HgB A1c     Need for pneumococcal 20-valent conjugate vaccination       Relevant Orders   Pneumococcal conjugate vaccine 20-valent (Prevnar 20) (Completed)       I am having Tanda CHARLENA Blush maintain his cetirizine , Fish Oil, diclofenac  Sodium, tamsulosin , montelukast , and amLODipine .  No orders of the defined types were placed in this encounter.

## 2023-09-20 NOTE — Assessment & Plan Note (Signed)
 Stable on cpap which is monitored by Dr Maple Hudson .

## 2023-09-20 NOTE — Assessment & Plan Note (Addendum)
  Persistent clear mucus production, particularly at night. Currently using Singulair , Zyrtec , Flonase , and Navage. No sinus pain. -Continue current regimen.  -Use Flonase  after Navage to allow it to sit and work more effectively.  -Consider allergy testing and possible allergy shots.

## 2023-09-21 ENCOUNTER — Encounter: Payer: Self-pay | Admitting: Internal Medicine

## 2023-09-21 ENCOUNTER — Telehealth: Payer: Self-pay | Admitting: Family

## 2023-09-21 ENCOUNTER — Ambulatory Visit (INDEPENDENT_AMBULATORY_CARE_PROVIDER_SITE_OTHER): Payer: HMO | Admitting: Internal Medicine

## 2023-09-21 VITALS — BP 130/70 | HR 91 | Ht 66.0 in | Wt 200.4 lb

## 2023-09-21 DIAGNOSIS — G4733 Obstructive sleep apnea (adult) (pediatric): Secondary | ICD-10-CM | POA: Diagnosis not present

## 2023-09-21 DIAGNOSIS — R0981 Nasal congestion: Secondary | ICD-10-CM | POA: Diagnosis not present

## 2023-09-21 DIAGNOSIS — R972 Elevated prostate specific antigen [PSA]: Secondary | ICD-10-CM

## 2023-09-21 MED ORDER — CEFDINIR 300 MG PO CAPS: 300.0000 mg | ORAL_CAPSULE | Freq: Two times a day (BID) | ORAL | 0 refills | Status: AC

## 2023-09-21 NOTE — Telephone Encounter (Signed)
 Patient notified earlier today this was for a urology referral due to his elevation in PSA level.

## 2023-09-21 NOTE — Telephone Encounter (Signed)
 Please advise pt that his PSA has risen since last year and is mildly elevated.  I would lik efor him to meet with the urologist upstairs for this.  Sugar has risen some and is in the borderline diabetes range.  Please work on limiting which starches/sugars, exercise and weight loss.

## 2023-09-21 NOTE — Telephone Encounter (Signed)
 Copied from CRM [id number]. Topic: Referral - Question >> Sep 21, 2023 11:22 AM Leotis ORN wrote: Reason for CRM: PT received a call regarding a referral from the specialist, pt is confused and scared because  they weren't aware of what results prompted this, and assumed the clinic would reach out before the specialist pts wife would like to speak with someone regarding this  [phone]

## 2023-09-21 NOTE — Patient Instructions (Signed)
 You are doing fine with CPAP. We can continue auto 5-20.  For the possible sinus infection, we have sent cefdinir- 1 twice daily. Ok to continue the saline lavage.

## 2023-09-21 NOTE — Telephone Encounter (Signed)
 Patient notified of results and referral. Also notified of provider's comments and recommendations. He verbalized understanding.

## 2023-10-06 ENCOUNTER — Other Ambulatory Visit: Payer: Self-pay | Admitting: Family

## 2023-10-06 DIAGNOSIS — J309 Allergic rhinitis, unspecified: Secondary | ICD-10-CM

## 2023-10-09 ENCOUNTER — Encounter: Payer: Self-pay | Admitting: Internal Medicine

## 2023-10-11 ENCOUNTER — Ambulatory Visit (INDEPENDENT_AMBULATORY_CARE_PROVIDER_SITE_OTHER): Payer: HMO | Admitting: Urology

## 2023-10-11 ENCOUNTER — Encounter: Payer: Self-pay | Admitting: Urology

## 2023-10-11 VITALS — BP 134/82 | HR 91 | Ht 67.0 in | Wt 200.0 lb

## 2023-10-11 DIAGNOSIS — R972 Elevated prostate specific antigen [PSA]: Secondary | ICD-10-CM

## 2023-10-11 NOTE — Progress Notes (Signed)
Assessment: 1. Elevated PSA      Plan: Today had a long discussion with the patient regarding elevated PSA along with the issues and controversies regarding prostate cancer detection.  Today for following our discussion we have elected short interval follow-up with repeat PSA testing. Patient will follow-up in 6 weeks.  Will obtain iso-PSA 2 weeks prior.  Chief Complaint: elevated psa  History of Present Illness:  Joel Chen is a 68 y.o. male who is seen in consultation from Sandford Craze, NP for evaluation of elevated PSA.  Patient IS retired Designer, television/film set. Patient started tamsulosin 2 years ago for worsening lower urinary tract symptoms.  He states that the tamsulosin has helped tremendously.  He now has minimal lower urinary tract symptoms.  Current IPSS = 2 Analysis today is negative for significant blood or infection.  There is no family history of prostate cancer or other prostate disease  In 2022 the patient had an episode of flank pain and gross hematuria felt he passed a stone.  He was referred to urology at that time but was never seen.  PSA data: 06/2016  2.44 08/2018  2.91 10/2020   3.1 11/2021   2.93 09/2023   4.89  DRE:  40-50 gm, no n/i    Past Medical History:  Past Medical History:  Diagnosis Date   Chicken pox    Hyperlipidemia 04/29/2011   Hypertension    Mumps    Personal history of hyperthyroidism    "pt states his hyperthyroidism went away and hasn't had to take meds in 8 yrs."    Past Surgical History:  Past Surgical History:  Procedure Laterality Date   COLONOSCOPY  09/2009   hemorrhoids   ROOT CANAL  1998   WISDOM TOOTH EXTRACTION  1994    Allergies:  No Known Allergies  Family History:  Family History  Problem Relation Age of Onset   Heart disease Mother        Living   Hypertension Mother    Cancer Father 57       lung-Deceased   Healthy Sister        x2   Other Brother        Hyperglycemia/Pre-diabetes   Healthy  Brother        x1 [#2]   Heart disease Maternal Aunt    Hypertension Maternal Aunt    Dementia Maternal Grandfather    Allergies Daughter    Alpha-1 antitrypsin deficiency Neg Hx    Liver disease Neg Hx     Social History:  Social History   Tobacco Use   Smoking status: Never    Passive exposure: Yes   Smokeless tobacco: Never  Substance Use Topics   Alcohol use: Yes    Alcohol/week: 6.0 standard drinks of alcohol    Types: 6 Cans of beer per week    Comment: "couple" a week   Drug use: No    Review of symptoms:  Constitutional:  Negative for unexplained weight loss, night sweats, fever, chills ENT:  Negative for nose bleeds, sinus pain, painful swallowing CV:  Negative for chest pain, shortness of breath, exercise intolerance, palpitations, loss of consciousness Resp:  Negative for cough, wheezing, shortness of breath GI:  Negative for nausea, vomiting, diarrhea, bloody stools GU:  Positives noted in HPI; otherwise negative for gross hematuria, dysuria, urinary incontinence Neuro:  Negative for seizures, poor balance, limb weakness, slurred speech Psych:  Negative for lack of energy, depression, anxiety Endocrine:  Negative  for polydipsia, polyuria, symptoms of hypoglycemia (dizziness, hunger, sweating) Hematologic:  Negative for anemia, purpura, petechia, prolonged or excessive bleeding, use of anticoagulants  Allergic:  Negative for difficulty breathing or choking as a result of exposure to anything; no shellfish allergy; no allergic response (rash/itch) to materials, foods  Physical exam: Vitals:   10/11/23 1331  BP: 134/82  Pulse: 91    GENERAL APPEARANCE:  Well appearing, well developed, well nourished, NAD  GU:  nl external genitalia.  DRE:nst, prostate 40-50gm; no n/i  Results: UA clear

## 2023-10-18 LAB — URINALYSIS, ROUTINE W REFLEX MICROSCOPIC
Bilirubin, UA: NEGATIVE
Glucose, UA: NEGATIVE
Ketones, UA: NEGATIVE
Leukocytes,UA: NEGATIVE
Nitrite, UA: NEGATIVE
RBC, UA: NEGATIVE
Specific Gravity, UA: 1.02 (ref 1.005–1.030)
Urobilinogen, Ur: 1 mg/dL (ref 0.2–1.0)
pH, UA: 7.5 (ref 5.0–7.5)

## 2023-10-24 ENCOUNTER — Encounter: Payer: Self-pay | Admitting: Internal Medicine
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
## 2023-11-01 ENCOUNTER — Telehealth: Payer: Self-pay | Admitting: Internal Medicine

## 2023-11-01 NOTE — Telephone Encounter (Signed)
 Pt needs a yearly follow up.  The lov stated for him to return again around 09-20-2024. Pt can be seen throughout if he needed an appointment, but still must be seen yearly

## 2023-11-01 NOTE — Telephone Encounter (Signed)
 Patient was just seen in January and is wondering if he needs to be seen again for a yearly follow up. Please call and advise.

## 2023-11-02 ENCOUNTER — Ambulatory Visit: Payer: 59 | Admitting: Internal Medicine

## 2023-11-08 ENCOUNTER — Other Ambulatory Visit: Payer: HMO

## 2023-11-08 DIAGNOSIS — R972 Elevated prostate specific antigen [PSA]: Secondary | ICD-10-CM | POA: Diagnosis not present

## 2023-11-21 ENCOUNTER — Encounter: Payer: Self-pay | Admitting: Urology

## 2023-11-22 ENCOUNTER — Ambulatory Visit: Payer: HMO | Admitting: Urology

## 2023-11-22 ENCOUNTER — Encounter: Payer: Self-pay | Admitting: Urology

## 2023-11-22 VITALS — BP 161/82 | HR 75

## 2023-11-22 DIAGNOSIS — N401 Enlarged prostate with lower urinary tract symptoms: Secondary | ICD-10-CM

## 2023-11-22 DIAGNOSIS — N529 Male erectile dysfunction, unspecified: Secondary | ICD-10-CM | POA: Diagnosis not present

## 2023-11-22 DIAGNOSIS — R972 Elevated prostate specific antigen [PSA]: Secondary | ICD-10-CM

## 2023-11-22 LAB — URINALYSIS, ROUTINE W REFLEX MICROSCOPIC
Bilirubin, UA: NEGATIVE
Glucose, UA: NEGATIVE
Ketones, UA: NEGATIVE
Leukocytes,UA: NEGATIVE
Nitrite, UA: NEGATIVE
Protein,UA: NEGATIVE
RBC, UA: NEGATIVE
Specific Gravity, UA: <1.005 — ABNORMAL LOW (ref 1.005–1.030)
Urobilinogen, Ur: 0.2 mg/dL (ref 0.2–1.0)
pH, UA: 6 (ref 5.0–7.5)

## 2023-11-22 MED ORDER — TADALAFIL 20 MG PO TABS: ORAL_TABLET | ORAL | 11 refills | Status: DC

## 2023-11-22 NOTE — Progress Notes (Signed)
 Assessment: 1. Elevated PSA   2. Erectile dysfunction of organic origin   3. Benign prostatic hyperplasia with lower urinary tract symptoms, symptom details unspecified     Plan: Today I had a long discussion with the patient regarding his borderline PSA.  Given the decline on recent testing as well as a reassuring low iso-PSA we have elected ongoing observation.  Patient will return in 6 months with PSA prior to visit.  Also had a long discussion today with the patient regarding his erectile dysfunction using a goal oriented approach.  Following our discussion he would like to begin new medical therapy. Rx: Cialis 20 mg as needed  Patient is doing well from a BPH/LUTS standpoint-continue tamsulosin  Chief Complaint: Elevated psa/ED  HPI: Joel Chen is a 68 y.o. male who presents for continued evaluation of elevated PSA. Patient also has a new complaint today.  He reports that he has noticed over the last year or so decreased erectile function.  He reports that he does not get a fully rigid erection and sometimes he loses it early.  He has not tried any prior medication.  Please see my note 10/11/2023 at the time of initial visit for detailed history and exam. Patient also has BPH/lower urinary tract symptoms on tamsulosin which has helped significantly. here is no family history of prostate cancer or other prostate disease   In 2022 the patient had an episode of flank pain and gross hematuria felt he passed a stone.  He was referred to urology at that time but was never seen.   PSA data: 06/2016                       2.44 08/2018                       2.91 10/2020                         3.1 11/2021                         2.93 09/2023                         4.89  DRE:  40-50 gm, no n/I 11/2023   3.8 with low iso-PSA of 0.8  Portions of the above documentation were copied from a prior visit for review purposes only.  Allergies: No Known Allergies  PMH: Past Medical  History:  Diagnosis Date   Chicken pox    Hyperlipidemia 04/29/2011   Hypertension    Mumps    Personal history of hyperthyroidism    "pt states his hyperthyroidism went away and hasn't had to take meds in 8 yrs."    PSH: Past Surgical History:  Procedure Laterality Date   COLONOSCOPY  09/2009   hemorrhoids   ROOT CANAL  1998   WISDOM TOOTH EXTRACTION  1994    SH: Social History   Tobacco Use   Smoking status: Never    Passive exposure: Yes   Smokeless tobacco: Never  Substance Use Topics   Alcohol use: Yes    Alcohol/week: 6.0 standard drinks of alcohol    Types: 6 Cans of beer per week    Comment: "couple" a week   Drug use: No    ROS: Constitutional:  Negative for fever, chills, weight loss CV: Negative for  chest pain, previous MI, hypertension Respiratory:  Negative for shortness of breath, wheezing, sleep apnea, frequent cough GI:  Negative for nausea, vomiting, bloody stool, GERD  PE: BP (!) 161/82   Pulse 75  GENERAL APPEARANCE:  Well appearing, well developed, well nourished, NAD    Results: UA neg

## 2024-01-03 ENCOUNTER — Other Ambulatory Visit: Payer: Self-pay | Admitting: Family

## 2024-01-03 DIAGNOSIS — J309 Allergic rhinitis, unspecified: Secondary | ICD-10-CM

## 2024-03-19 ENCOUNTER — Encounter: Payer: Self-pay | Admitting: Family

## 2024-03-19 ENCOUNTER — Ambulatory Visit: Payer: HMO | Admitting: Family

## 2024-03-19 VITALS — BP 130/78 | HR 85 | Temp 98.3°F | Wt 198.6 lb

## 2024-03-19 DIAGNOSIS — G4733 Obstructive sleep apnea (adult) (pediatric): Secondary | ICD-10-CM

## 2024-03-19 DIAGNOSIS — N401 Enlarged prostate with lower urinary tract symptoms: Secondary | ICD-10-CM | POA: Diagnosis not present

## 2024-03-19 DIAGNOSIS — I10 Essential (primary) hypertension: Secondary | ICD-10-CM

## 2024-03-19 DIAGNOSIS — J309 Allergic rhinitis, unspecified: Secondary | ICD-10-CM

## 2024-03-19 DIAGNOSIS — E785 Hyperlipidemia, unspecified: Secondary | ICD-10-CM | POA: Diagnosis not present

## 2024-03-19 NOTE — Assessment & Plan Note (Signed)
 Lab Results  Component Value Date   TSH 1.61 11/15/2021

## 2024-03-19 NOTE — Assessment & Plan Note (Signed)
 BP Readings from Last 3 Encounters:  03/19/24 130/78  11/22/23 (!) 161/82  10/11/23 134/82   On amlodipine  and accupril .

## 2024-03-19 NOTE — Assessment & Plan Note (Signed)
 Stable on CPAP which is being managed by Dr. Neysa.

## 2024-03-19 NOTE — Assessment & Plan Note (Signed)
 Stable on flomax .   Lab Results  Component Value Date   PSA 4.89 (H) 09/20/2023   PSA 2.93 11/15/2021   PSA 3.10 10/16/2020   Following with Urology for elevated PSA.  Plan is for surveillance.

## 2024-03-19 NOTE — Patient Instructions (Signed)
 Please complete lab work prior to leaving. Follow up with urology as scheduled. Continue current medications.

## 2024-03-19 NOTE — Progress Notes (Signed)
 Subjective:     Patient ID: Joel Chen, male    DOB: 1956/08/15, 68 y.o.   MRN: [id number]  Chief Complaint  Patient presents with   Hypertension    HPI  Discussed the use of AI scribe software for clinical note transcription with the patient, who gave verbal consent to proceed.  History of Present Illness  Patient is a 68 yr old male who presents today for follow up.  He is saddened by the loss of his two dogs. One who belonged to his late daughter and another dog that she gave to him prior to her death.   Health wise he is feeling well and trying to walk more/lose weight.    He is voiding well on flomax .  He has chronic allergic rhinitis which he manages with Navage and singulair , prn flonase . Does not notice much improvement with oral antihistamines so he does not take regularly.      Health Maintenance Due  Topic Date Due   Medicare Annual Wellness (AWV)  Never done    Past Medical History:  Diagnosis Date   Chicken pox    Hyperlipidemia 04/29/2011   Hypertension    Mumps    Personal history of hyperthyroidism    pt states his hyperthyroidism went away and hasn't had to take meds in 8 yrs.    Past Surgical History:  Procedure Laterality Date   COLONOSCOPY  09/2009   hemorrhoids   ROOT CANAL  1998   WISDOM TOOTH EXTRACTION  1994    Family History  Problem Relation Age of Onset   Heart disease Mother        Living   Hypertension Mother    Cancer Father 76       lung-Deceased   Healthy Sister        x2   Other Brother        Hyperglycemia/Pre-diabetes   Healthy Brother        x1 [#2]   Heart disease Maternal Aunt    Hypertension Maternal Aunt    Dementia Maternal Grandfather    Allergies Daughter    Alpha-1 antitrypsin deficiency Neg Hx    Liver disease Neg Hx     Social History   Socioeconomic History   Marital status: Married    Spouse name: Not on file   Number of children: 2   Years of education: Not on file   Highest  education level: Not on file  Occupational History   Occupation: DJ    Comment: self-employed  Tobacco Use   Smoking status: Never    Passive exposure: Yes   Smokeless tobacco: Never  Substance and Sexual Activity   Alcohol use: Yes    Alcohol/week: 6.0 standard drinks of alcohol    Types: 6 Cans of beer per week    Comment: couple a week   Drug use: No   Sexual activity: Yes    Partners: Female  Other Topics Concern   Not on file  Social History Narrative   Regular exercise:  Active at work. Walks 2 x weekly.   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 90 tablet 1   montelukast  (SINGULAIR ) 10 MG tablet TAKE 1 TABLET BY MOUTH EVERY DAY 90 tablet 1   tadalafil  (CIALIS ) 20 MG tablet Take one tablet 2-3 hrs prior to sexual activity no more than every other day. 10 tablet 11   tamsulosin  (FLOMAX ) 0.4 MG CAPS capsule TAKE 1 CAPSULE BY MOUTH EVERY DAY 90 capsule 1   cetirizine  (ZYRTEC ) 10 MG tablet Take 1 tablet (10 mg total) by mouth daily. (Patient not taking: Reported on 03/19/2024) 30 tablet 11   No facility-administered medications prior to visit.    No Known Allergies  ROS See HPI    Objective:    Physical Exam Constitutional:      General: He is not in acute distress.    Appearance: He is well-developed.  HENT:     Head: Normocephalic and atraumatic.  Cardiovascular:     Rate and Rhythm: Normal rate and regular rhythm.     Heart sounds: No murmur  heard. Pulmonary:     Effort: Pulmonary effort is normal. No respiratory distress.     Breath sounds: Normal breath sounds. No wheezing or rales.  Skin:    General: Skin is warm and dry.  Neurological:     Mental Status: He is alert and oriented to person, place, and time.  Psychiatric:        Behavior: Behavior normal.        Thought Content: Thought content normal.      BP 130/78   Pulse 85   Temp 98.3 F (36.8 C)   Wt 198 lb 9.6 oz (90.1 kg)   SpO2 98%   BMI 31.11 kg/m  Wt Readings from Last 3 Encounters:  03/19/24 198 lb 9.6 oz (90.1 kg)  10/11/23 200 lb (90.7 kg)  09/21/23 200 lb 6.4 oz (90.9 kg)       Assessment & Plan:   Problem List Items Addressed This Visit       Unprioritized   OSA (obstructive sleep apnea) - Primary   Stable on CPAP which is being managed by Dr. Neysa.       Hypertension   BP Readings from Last 3 Encounters:  03/19/24 130/78  11/22/23 (!) 161/82  10/11/23 134/82   On amlodipine  and accupril .        Hyperlipidemia   Lab Results  Component Value Date   CHOL 216 (H) 09/20/2023   HDL 57.70 09/20/2023   LDLCALC 135 (H) 09/20/2023   LDLDIRECT 124.0 10/16/2020   TRIG 113.0 09/20/2023   CHOLHDL 4 09/20/2023   Not on statin, focusing on diet/lifestyle changes.       Benign prostatic hyperplasia with lower urinary tract symptoms   Stable on flomax .   Lab Results  Component Value Date   PSA 4.89 (H) 09/20/2023   PSA 2.93 11/15/2021   PSA 3.10 10/16/2020   Following with Urology for elevated PSA.  Plan is for surveillance.        Allergic rhinitis   Stable on singulair , flonase  and use of Navage.         I have discontinued Tanda DRAFTS Heyde's cetirizine . I am also having him maintain his tadalafil , amLODipine , montelukast , and tamsulosin .  No orders of  the defined types were placed in this encounter.

## 2024-03-19 NOTE — Assessment & Plan Note (Addendum)
 Stable on singulair , flonase  and use of Navage.

## 2024-03-19 NOTE — Assessment & Plan Note (Addendum)
 Lab Results  Component Value Date   CHOL 216 (H) 09/20/2023   HDL 57.70 09/20/2023   LDLCALC 135 (H) 09/20/2023   LDLDIRECT 124.0 10/16/2020   TRIG 113.0 09/20/2023   CHOLHDL 4 09/20/2023   Not on statin, focusing on diet/lifestyle changes.

## 2024-05-16 ENCOUNTER — Other Ambulatory Visit: Payer: Self-pay

## 2024-05-16 DIAGNOSIS — R972 Elevated prostate specific antigen [PSA]: Secondary | ICD-10-CM

## 2024-05-17 LAB — PSA, TOTAL AND FREE
PSA, Free Pct: 25.3 %
PSA, Free: 1.14 ng/mL
Prostate Specific Ag, Serum: 4.5 ng/mL — ABNORMAL HIGH (ref 0.0–4.0)

## 2024-05-23 ENCOUNTER — Encounter: Payer: Self-pay | Admitting: Urology

## 2024-05-23 ENCOUNTER — Ambulatory Visit: Admitting: Urology

## 2024-05-23 VITALS — BP 145/78 | HR 76 | Ht 67.0 in | Wt 200.0 lb

## 2024-05-23 DIAGNOSIS — R972 Elevated prostate specific antigen [PSA]: Secondary | ICD-10-CM | POA: Diagnosis not present

## 2024-05-23 DIAGNOSIS — N401 Enlarged prostate with lower urinary tract symptoms: Secondary | ICD-10-CM

## 2024-05-23 DIAGNOSIS — N529 Male erectile dysfunction, unspecified: Secondary | ICD-10-CM

## 2024-05-23 LAB — URINALYSIS, ROUTINE W REFLEX MICROSCOPIC
Bilirubin, UA: NEGATIVE
Glucose, UA: NEGATIVE
Ketones, UA: NEGATIVE
Leukocytes,UA: NEGATIVE
Nitrite, UA: NEGATIVE
Protein,UA: NEGATIVE
RBC, UA: NEGATIVE
Specific Gravity, UA: 1.02 (ref 1.005–1.030)
Urobilinogen, Ur: 0.2 mg/dL (ref 0.2–1.0)
pH, UA: 6.5 (ref 5.0–7.5)

## 2024-05-23 MED ORDER — TADALAFIL 20 MG PO TABS: ORAL_TABLET | ORAL | 3 refills | Status: AC

## 2024-05-23 MED ORDER — TADALAFIL 20 MG PO TABS: ORAL_TABLET | ORAL | 3 refills | Status: DC

## 2024-05-23 MED ORDER — TAMSULOSIN HCL 0.4 MG PO CAPS: 0.4000 mg | ORAL_CAPSULE | Freq: Every day | ORAL | 3 refills | Status: AC

## 2024-05-23 NOTE — Progress Notes (Signed)
 Assessment: 1. Elevated PSA   2. Benign prostatic hyperplasia with lower urinary tract symptoms, symptom details unspecified   3. Erectile dysfunction of organic origin     Plan: I personally reviewed the patient's chart including provider notes, lab results. Continue tamsulosin  0.4 mg daily. Continue tadalafil  20 mg as needed. I discussed the potential significance of his elevated PSA in regards to prostate cancer.  I discussed further evaluation with urine biomarker testing, prostate MRI, and possible prostate biopsy.  He would like to continue to monitor at this time. Information on MPS2.0 provided Return to office in 6 months with PHI.  Chief Complaint: Chief Complaint  Patient presents with   Elevated PSA    HPI: Joel Chen is a 68 y.o. male who presents for continued evaluation of elevated PSA, BPH with lower urinary tract symptoms, and erectile dysfunction. He was previously seen by Dr. Shona with his last visit in March 2025.  Elevated PSA: PSA data: 06/2016                       2.44 08/2018                       2.91 10/2020                         3.1 11/2021                         2.93 09/2023                         4.89  DRE:  40-50 gm, no n/I 11/2023                         3.8 with low iso-PSA of 0.8 9/25    4.5 with 25.3% free  Patient also has BPH/lower urinary tract symptoms on tamsulosin  which has helped significantly.  He reported gradually worsening erectile dysfunction at his visit in March 2025.  He was having difficulty achieving and adequate erection and rapid detumescence.  He had not tried any medication previously. He was given a prescription for tadalafil  20 mg as needed.  He returns today for scheduled follow-up.  He continues on tamsulosin .  His lower urinary tract symptoms are well-controlled.  No dysuria or gross hematuria. IPSS = 4/2. He continues to use tadalafil  20 mg as needed with improvement in his erections.  Portions of the  above documentation were copied from a prior visit for review purposes only.  Allergies: No Known Allergies  PMH: Past Medical History:  Diagnosis Date   Chicken pox    Hyperlipidemia 04/29/2011   Hypertension    Mumps    Personal history of hyperthyroidism    pt states his hyperthyroidism went away and hasn't had to take meds in 8 yrs.    PSH: Past Surgical History:  Procedure Laterality Date   COLONOSCOPY  09/2009   hemorrhoids   ROOT CANAL  1998   WISDOM TOOTH EXTRACTION  1994    SH: Social History   Tobacco Use   Smoking status: Never    Passive exposure: Yes   Smokeless tobacco: Never  Substance Use Topics   Alcohol use: Yes    Alcohol/week: 6.0 standard drinks of alcohol    Types: 6 Cans of beer per week  Comment: couple a week   Drug use: No    ROS: Constitutional:  Negative for fever, chills, weight loss CV: Negative for chest pain, previous MI, hypertension Respiratory:  Negative for shortness of breath, wheezing, sleep apnea, frequent cough GI:  Negative for nausea, vomiting, bloody stool, GERD  PE: BP (!) 145/78   Pulse 76   Ht 5' 7 (1.702 m)   Wt 200 lb (90.7 kg)   BMI 31.32 kg/m  GENERAL APPEARANCE:  Well appearing, well developed, well nourished, NAD HEENT:  Atraumatic, normocephalic, oropharynx clear NECK:  Supple without lymphadenopathy or thyromegaly ABDOMEN:  Soft, non-tender, no masses EXTREMITIES:  Moves all extremities well, without clubbing, cyanosis, or edema NEUROLOGIC:  Alert and oriented x 3, normal gait, CN II-XII grossly intact MENTAL STATUS:  appropriate BACK:  Non-tender to palpation, No CVAT SKIN:  Warm, dry, and intact   Results: U/A:  negative

## 2024-06-05 ENCOUNTER — Telehealth: Payer: Self-pay | Admitting: Family

## 2024-06-05 NOTE — Telephone Encounter (Signed)
 Copied from CRM #[id number]. Topic: Medicare AWV >> Jun 05, 2024 11:29 AM Nathanel DEL wrote: Reason for CRM: Called LVM 06/05/2024 to schedule AWV. Please schedule Virtual or Telehealth visits ONLY  Nathanel Paschal; Care Guide Ambulatory Clinical Support [facility] Direct Dial: [phone]

## 2024-06-26 ENCOUNTER — Other Ambulatory Visit: Payer: Self-pay | Admitting: Family

## 2024-06-26 DIAGNOSIS — J309 Allergic rhinitis, unspecified: Secondary | ICD-10-CM

## 2024-07-08 ENCOUNTER — Other Ambulatory Visit (HOSPITAL_BASED_OUTPATIENT_CLINIC_OR_DEPARTMENT_OTHER): Payer: Self-pay

## 2024-07-10 ENCOUNTER — Ambulatory Visit: Admitting: *Deleted

## 2024-07-10 VITALS — Ht 66.0 in | Wt 198.0 lb

## 2024-07-10 DIAGNOSIS — Z0000 Encounter for general adult medical examination without abnormal findings: Secondary | ICD-10-CM

## 2024-07-10 NOTE — Progress Notes (Signed)
 Subjective:   Joel Chen is a 68 y.o. who presents for a Medicare Wellness preventive visit.  As a reminder, Annual Wellness Visits don't include a physical exam, and some assessments may be limited, especially if this visit is performed virtually. We may recommend an in-person follow-up visit with your provider if needed.  Visit Complete: Virtual I connected with  Joel Chen on 07/10/24 by a audio enabled telemedicine application and verified that I am speaking with the correct person using two identifiers.  Patient Location: Home  Provider Location: Office/Clinic  I discussed the limitations of evaluation and management by telemedicine. The patient expressed understanding and agreed to proceed.  Vital Signs: Because this visit was a virtual/telehealth visit, some criteria may be missing or patient reported. Any vitals not documented were not able to be obtained and vitals that have been documented are patient reported.  VideoDeclined- This patient declined Librarian, academic. Therefore the visit was completed with audio only.  Persons Participating in Visit: Patient.  AWV Questionnaire: No: Patient Medicare AWV questionnaire was not completed prior to this visit.  Cardiac Risk Factors include: advanced age (>44men, >85 women);male gender;hypertension;dyslipidemia;Other (see comment), Risk factor comments: OSA     Objective:    Today's Vitals   07/10/24 0901  Weight: 198 lb (89.8 kg)  Height: 5' 6 (1.676 m)   Body mass index is 31.96 kg/m.     07/10/2024    9:09 AM  Advanced Directives  Does Patient Have a Medical Advance Directive? No  Would patient like information on creating a medical advance directive? No - Patient declined    Current Medications (verified) Outpatient Encounter Medications as of 07/10/2024  Medication Sig   amLODipine  (NORVASC ) 5 MG tablet Take 1 tablet (5 mg total) by mouth daily.   montelukast   (SINGULAIR ) 10 MG tablet Take 1 tablet (10 mg total) by mouth daily.   tadalafil  (CIALIS ) 20 MG tablet Take one tablet 2-3 hrs prior to sexual activity no more than every other day.   tamsulosin  (FLOMAX ) 0.4 MG CAPS capsule Take 1 capsule (0.4 mg total) by mouth daily.   [DISCONTINUED] quinapril  (ACCUPRIL ) 10 MG tablet Take 1 tablet (10 mg total) by mouth every morning.   No facility-administered encounter medications on file as of 07/10/2024.    Allergies (verified) Patient has no known allergies.   History: Past Medical History:  Diagnosis Date   Chicken pox    Hyperlipidemia 04/29/2011   Hypertension    Mumps    Personal history of hyperthyroidism    pt states his hyperthyroidism went away and hasn't had to take meds in 8 yrs.   Past Surgical History:  Procedure Laterality Date   COLONOSCOPY  09/2009   hemorrhoids   ROOT CANAL  1998   WISDOM TOOTH EXTRACTION  1994   Family History  Problem Relation Age of Onset   Heart disease Mother        Living   Hypertension Mother    Cancer Father 109       lung-Deceased   Healthy Sister        x2   Other Brother        Hyperglycemia/Pre-diabetes   Healthy Brother        x1 [#2]   Heart disease Maternal Aunt    Hypertension Maternal Aunt    Dementia Maternal Grandfather    Allergies Daughter    Alpha-1 antitrypsin deficiency Neg Hx    Liver disease Neg Hx  Social History   Socioeconomic History   Marital status: Married    Spouse name: Not on file   Number of children: 2   Years of education: Not on file   Highest education level: Not on file  Occupational History   Occupation: DJ    Comment: self-employed  Tobacco Use   Smoking status: Never    Passive exposure: Yes   Smokeless tobacco: Never  Substance and Sexual Activity   Alcohol use: Yes    Alcohol/week: 2.0 standard drinks of alcohol    Types: 2 Cans of beer per week    Comment: couple a week   Drug use: No   Sexual activity: Yes    Partners:  Female  Other Topics Concern   Not on file  Social History Narrative   Regular exercise:  Active at work. Walks 2 x weekly.   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   Has another daughter               Social Drivers of Corporate Investment Banker Strain: Low Risk  (07/10/2024)   Overall Financial Resource Strain (CARDIA)    Difficulty of Paying Living Expenses: Not very hard  Food Insecurity: No Food Insecurity (07/10/2024)   Hunger Vital Sign    Worried About Running Out of Food in the Last Year: Never true    Ran Out of Food in the Last Year: Never true  Transportation Needs: No Transportation Needs (07/10/2024)   PRAPARE - Administrator, Civil Service (Medical): No    Lack of Transportation (Non-Medical): No  Physical Activity: Insufficiently Active (07/10/2024)   Exercise Vital Sign    Days of Exercise per Week: 3 days    Minutes of Exercise per Session: 20 min  Stress: No Stress Concern Present (07/10/2024)   [employer] of Occupational Health - Occupational Stress Questionnaire    Feeling of Stress: Not at all  Social Connections: Moderately Integrated (07/10/2024)   Social Connection and Isolation Panel    Frequency of Communication with Friends and Family: Twice a week    Frequency of Social Gatherings with Friends and Family: Twice a week    Attends Religious Services: More than 4 times per year    Active Member of [employer] or Organizations: No    Attends Engineer, Structural: Never    Marital Status: Married    Tobacco Counseling Counseling given: Not Answered    Clinical Intake:  Pre-visit preparation completed: Yes  Pain : No/denies pain     BMI - recorded: 31.96 Nutritional Status: BMI > 30  Obese Nutritional Risks: None Diabetes: No  Lab Results  Component Value Date   HGBA1C 6.0 09/20/2023   HGBA1C 5.6 03/10/2023   HGBA1C 5.7 05/18/2022     How often do  you need to have someone help you when you read instructions, pamphlets, or other written materials from your doctor or pharmacy?: 1 - Never  Interpreter Needed?: No  Information entered by :: Joel Chen, CMA(AAMA)   Activities of Daily Living     07/10/2024    9:04 AM  In your present state of health, do you have any difficulty performing the following activities:  Hearing? 0  Vision? 0  Difficulty concentrating or making decisions? 0  Walking or climbing stairs? 0  Dressing or bathing? 0  Doing errands, shopping? 0  Preparing Food and  eating ? N  Using the Toilet? N  In the past six months, have you accidently leaked urine? N  Do you have problems with loss of bowel control? N  Managing your Medications? N  Managing your Finances? N  Housekeeping or managing your Housekeeping? N    Patient Care Team: Daryl Setter, NP as PCP - General (Internal Medicine) Dianna Specking, MD as Consulting Physician (Gastroenterology) [facility]  I have updated your Care Teams any recent Medical Services you may have received from other providers in the past year.     Assessment:   This is a routine wellness examination for Quy.  Hearing/Vision screen Hearing Screening - Comments:: Denies hearing difficulties.  Vision Screening - Comments:: Up to date with routine eye exams with [facility]   Goals Addressed             This Visit's Progress    To increase exercise to 9min/5days a wk         Depression Screen     07/10/2024    9:08 AM 03/10/2023   10:03 AM 11/15/2021   10:28 AM 10/16/2020   11:24 AM 08/15/2018    8:56 AM 08/14/2017   10:56 AM 07/06/2015    3:02 PM  PHQ 2/9 Scores  PHQ - 2 Score 0 0 0 0 0 1 0  PHQ- 9 Score 0 0    1     Fall Risk     07/10/2024    9:05 AM 03/10/2023   10:03 AM 11/15/2021   10:28 AM  Fall Risk   Falls in the past year? 0 0 0  Number falls in past yr: 0 0 0  Injury with Fall? 0 0 0  Risk for fall due to  : No Fall Risks No Fall Risks   Follow up Education provided Falls evaluation completed     MEDICARE RISK AT HOME:  Medicare Risk at Home Any stairs in or around the home?: Yes If so, are there any without handrails?: No Home free of loose throw rugs in walkways, pet beds, electrical cords, etc?: Yes Adequate lighting in your home to reduce risk of falls?: Yes Life alert?: No Use of a cane, walker or w/c?: No Grab bars in the bathroom?: No Shower chair or bench in shower?: No Elevated toilet seat or a handicapped toilet?: No  TIMED UP AND GO:  Was the test performed?  No,audio  Cognitive Function: 6CIT completed        07/10/2024    9:10 AM  6CIT Screen  What Year? 0 points  What month? 0 points  What time? 0 points  Count back from 20 0 points  Months in reverse 0 points  Repeat phrase 2 points  Total Score 2 points    Immunizations Immunization History  Administered Date(s) Administered   Influenza Split 06/04/2012, 07/06/2015, 10/07/2016, 08/14/2017, 05/16/2018, 08/15/2018   Influenza, Seasonal, Injecte, Preservative Fre 07/19/2023   Influenza,inj,Quad PF,6+ Mos 07/06/2015, 10/07/2016, 08/14/2017, 05/16/2018, 08/15/2018, 07/05/2019, 10/16/2020, 08/17/2021, 05/18/2022   Influenza-Unspecified 07/13/2013   PFIZER Comirnaty ETTERGray Top)Covid-19 Tri-Sucrose Vaccine 04/23/2021   PFIZER(Purple Top)SARS-COV-2 Vaccination 12/01/2019, 12/24/2019, 08/14/2020, 08/17/2021   PNEUMOCOCCAL CONJUGATE-20 09/20/2023   Pfizer(Comirnaty )Fall Seasonal Vaccine 12 years and older 08/11/2022, 08/25/2023   Pneumococcal Conjugate-13 10/13/2010   Td 09/12/2006   Tdap 08/14/2017   Zoster Recombinant(Shingrix) 10/16/2020, 03/31/2021    Screening Tests Health Maintenance  Topic Date Due   Influenza Vaccine  04/12/2024   COVID-19 Vaccine (8 - 2025-26  season) 05/13/2024   Medicare Annual Wellness (AWV)  07/10/2025   DTaP/Tdap/Td (3 - Td or Tdap) 08/15/2027   Colonoscopy  01/06/2030    Pneumococcal Vaccine: 50+ Years  Completed   Zoster Vaccines- Shingrix  Completed   Meningococcal B Vaccine  Aged Out   Hepatitis C Screening  Discontinued    Health Maintenance Items Addressed: Will get flu and COVID vaccines at pharmacy.  Additional Screening:  Vision Screening: Recommended annual ophthalmology exams for early detection of glaucoma and other disorders of the eye. Is the patient up to date with their annual eye exam?  Yes  Who is the provider or what is the name of the office in which the patient attends annual eye exams? [facility]  Dental Screening: Recommended annual dental exams for proper oral hygiene  Community Resource Referral / Chronic Care Management: CRR required this visit?  No   CCM required this visit?  No   Plan:    I have personally reviewed and noted the following in the patient's chart:   Medical and social history Use of alcohol, tobacco or illicit drugs  Current medications and supplements including opioid prescriptions. Patient is not currently taking opioid prescriptions. Functional ability and status Nutritional status Physical activity Advanced directives List of other physicians Hospitalizations, surgeries, and ER visits in previous 12 months Vitals Screenings to include cognitive, depression, and falls Referrals and appointments  In addition, I have reviewed and discussed with patient certain preventive protocols, quality metrics, and best practice recommendations. A written personalized care plan for preventive services as well as general preventive health recommendations were provided to patient.   Joel Chen, CMA   07/10/2024   After Visit Summary: (MyChart) Due to this being a telephonic visit, the after visit summary with patients personalized plan was offered to patient via MyChart   Notes: Nothing significant to report at this time.

## 2024-07-10 NOTE — Patient Instructions (Signed)
 [name] , Thank you for taking time out of your busy schedule to complete your Annual Wellness Visit with me. I enjoyed our conversation and look forward to speaking with you again next year. I, as well as your care team,  appreciate your ongoing commitment to your health goals. Please review the following plan we discussed and let me know if I can assist you in the future. Your Game plan/ To Do List    Follow up Visits: Next Medicare AWV with our clinical staff: 07/11/25 9:40am, telephone   Next Office Visit with your provider: 09/24/24 11:20,  Eleanor Ponto, NP  Clinician Recommendations:  Aim for 30 minutes of exercise or brisk walking, 6-8 glasses of water, and 5 servings of fruits and vegetables each day.    You will need to get the following vaccines at your local pharmacy: Flu, Covid     This is a list of the screening recommended for you and due dates:  Health Maintenance  Topic Date Due   Medicare Annual Wellness Visit  Never done   Flu Shot  04/12/2024   COVID-19 Vaccine (8 - 2025-26 season) 05/13/2024   DTaP/Tdap/Td vaccine (3 - Td or Tdap) 08/15/2027   Colon Cancer Screening  01/06/2030   Pneumococcal Vaccine for age over 6  Completed   Zoster (Shingles) Vaccine  Completed   Meningitis B Vaccine  Aged Out   Hepatitis C Screening  Discontinued    Advanced directives: (Declined) Advance directive discussed with you today. Even though you declined this today, please call our office should you change your mind, and we can give you the proper paperwork for you to fill out. Advance Care Planning is important because it:  [x]  Makes sure you receive the medical care that is consistent with your values, goals, and preferences  [x]  It provides guidance to your family and loved ones and reduces their decisional burden about whether or not they are making the right decisions based on your wishes.  Follow the link provided in your after visit summary or read over the  paperwork we have mailed to you to help you started getting your Advance Directives in place. If you need assistance in completing these, please reach out to us  so that we can help you!  See attachments for Preventive Care and Fall Prevention Tips.

## 2024-07-12 ENCOUNTER — Other Ambulatory Visit (HOSPITAL_BASED_OUTPATIENT_CLINIC_OR_DEPARTMENT_OTHER): Payer: Self-pay

## 2024-07-12 MED ORDER — FLUZONE HIGH-DOSE 0.5 ML IM SUSY
0.5000 mL | PREFILLED_SYRINGE | Freq: Once | INTRAMUSCULAR | 0 refills | Status: AC
  Filled 2024-07-12: qty 0.5, 1d supply, fill #0

## 2024-07-16 ENCOUNTER — Ambulatory Visit: Admitting: Orthopaedic Surgery

## 2024-07-16 ENCOUNTER — Other Ambulatory Visit (INDEPENDENT_AMBULATORY_CARE_PROVIDER_SITE_OTHER): Payer: Self-pay

## 2024-07-16 DIAGNOSIS — G8929 Other chronic pain: Secondary | ICD-10-CM | POA: Diagnosis not present

## 2024-07-16 DIAGNOSIS — M25561 Pain in right knee: Secondary | ICD-10-CM

## 2024-07-16 MED ORDER — LIDOCAINE HCL 1 % IJ SOLN
2.0000 mL | INTRAMUSCULAR | Status: AC | PRN
  Administered 2024-07-16: 2 mL

## 2024-07-16 MED ORDER — BUPIVACAINE HCL 0.5 % IJ SOLN
2.0000 mL | INTRAMUSCULAR | Status: AC | PRN
  Administered 2024-07-16: 2 mL via INTRA_ARTICULAR

## 2024-07-16 MED ORDER — METHYLPREDNISOLONE ACETATE 40 MG/ML IJ SUSP
40.0000 mg | INTRAMUSCULAR | Status: AC | PRN
  Administered 2024-07-16: 40 mg via INTRA_ARTICULAR

## 2024-07-16 NOTE — Progress Notes (Signed)
 Office Visit Note   Patient: Joel Chen           Date of Birth: [birth year]           MRN: [id number] Visit Date: 07/16/2024              Requested by: Daryl Setter, NP [id number] [place],  [place] [address] PCP: Daryl Setter, NP   Assessment & Plan: Visit Diagnoses:  1. Chronic pain of right knee     Plan: History of Present Illness Joel Chen is a 68 year old male who presents with right knee pain following a popping sensation a month ago.  A month ago, while taking out the trash and going up a hill, he experienced a popping sensation in the back of his right leg, described as a 'snap'. He was able to continue his activities afterward.  Since the incident, the pain has not improved and may have worsened. Pain is present around the quadriceps area, sometimes radiating down the leg. There is some swelling in the knee, though the extent is difficult to assess. He uses braces to manage the fluid in the knee, which provides some relief. Occasional pain occurs along the joint line with a sensation of pressure in the leg.  There is no catching, locking, or significant giving way of the knee, although he felt it 'wanted to give away' last night. He is trying to stay active despite the discomfort.  Physical Exam MUSCULOSKELETAL: Right knee effusion and joint line tenderness present.  Results RADIOLOGY Right knee X-ray: No abnormalities noted. Left knee X-ray: Increased arthritic changes compared to the right knee.  Assessment and Plan Right knee pain with effusion and suspected medial meniscus tear Suspected medial meniscus tear with effusion. Not reporting mechanical symptoms.  Effusion is causing most of the symptoms. - aspirated 30 cc of synovial fluid with meniscal fragments, injected steroid afterwards.   - will need MRI scan if symptoms recur.  Follow-Up Instructions: No follow-ups on file.   Orders:  Orders Placed This Encounter   Procedures   Large Joint Inj   XR KNEE 3 VIEW RIGHT   No orders of the defined types were placed in this encounter.     Procedures: Large Joint Inj: R knee on 07/16/2024 11:31 AM Indications: pain Details: 22 G needle  Arthrogram: No  Medications: 40 mg methylPREDNISolone  acetate 40 MG/ML; 2 mL lidocaine  1 %; 2 mL bupivacaine  0.5 % Aspirate: 30 mL clear Outcome: tolerated well, no immediate complications Consent was given by the patient. Patient was prepped and draped in the usual sterile fashion.       Clinical Data: No additional findings.   Subjective: Chief Complaint  Patient presents with   Right Knee - Pain    HPI  Review of Systems  Constitutional: Negative.   HENT: Negative.    Eyes: Negative.   Respiratory: Negative.    Cardiovascular: Negative.   Gastrointestinal: Negative.   Endocrine: Negative.   Genitourinary: Negative.   Skin: Negative.   Allergic/Immunologic: Negative.   Neurological: Negative.   Hematological: Negative.   Psychiatric/Behavioral: Negative.    All other systems reviewed and are negative.    Objective: Vital Signs: There were no vitals taken for this visit.  Physical Exam Vitals and nursing note reviewed.  Constitutional:      Appearance: He is well-developed.  HENT:     Head: Normocephalic and atraumatic.  Eyes:     Pupils: Pupils  are equal, round, and reactive to light.  Pulmonary:     Effort: Pulmonary effort is normal.  Abdominal:     Palpations: Abdomen is soft.  Musculoskeletal:        General: Normal range of motion.     Cervical back: Neck supple.  Skin:    General: Skin is warm.  Neurological:     Mental Status: He is alert and oriented to person, place, and time.  Psychiatric:        Behavior: Behavior normal.        Thought Content: Thought content normal.        Judgment: Judgment normal.     Ortho Exam  Specialty Comments:  No specialty comments available.  Imaging: XR KNEE 3 VIEW  RIGHT Result Date: 07/16/2024 X-rays of the right knee show minor arthritic changes.  Well-preserved joint spaces.    PMFS History: Patient Active Problem List   Diagnosis Date Noted   Primary osteoarthritis of both knees 03/10/2023   Olecranon bursitis of left elbow 12/31/2021   Chronic pain of left knee 11/15/2021   Allergic rhinitis 08/17/2021   Benign prostatic hyperplasia with lower urinary tract symptoms 07/09/2016   OSA (obstructive sleep apnea) 07/06/2015   Preventative health care 11/17/2013   Hyperlipidemia 04/29/2011   Hypertension 04/08/2011   Personal history of hyperthyroidism 04/08/2011   Past Medical History:  Diagnosis Date   Chicken pox    Hyperlipidemia 04/29/2011   Hypertension    Mumps    Personal history of hyperthyroidism    pt states his hyperthyroidism went away and hasn't had to take meds in 8 yrs.    Family History  Problem Relation Age of Onset   Heart disease Mother        Living   Hypertension Mother    Cancer Father 88       lung-Deceased   Healthy Sister        x2   Other Brother        Hyperglycemia/Pre-diabetes   Healthy Brother        x1 [#2]   Heart disease Maternal Aunt    Hypertension Maternal Aunt    Dementia Maternal Grandfather    Allergies Daughter    Alpha-1 antitrypsin deficiency Neg Hx    Liver disease Neg Hx     Past Surgical History:  Procedure Laterality Date   COLONOSCOPY  09/2009   hemorrhoids   ROOT CANAL  1998   WISDOM TOOTH EXTRACTION  1994   Social History   Occupational History   Occupation: DJ    Comment: self-employed  Tobacco Use   Smoking status: Never    Passive exposure: Yes   Smokeless tobacco: Never  Substance and Sexual Activity   Alcohol use: Yes    Alcohol/week: 2.0 standard drinks of alcohol    Types: 2 Cans of beer per week    Comment: couple a week   Drug use: No   Sexual activity: Yes    Partners: Female

## 2024-09-17 ENCOUNTER — Ambulatory Visit: Payer: HMO | Admitting: Internal Medicine

## 2024-09-24 ENCOUNTER — Ambulatory Visit: Admitting: Family

## 2024-09-24 VITALS — BP 139/75 | HR 65 | Temp 98.0°F | Resp 16 | Ht 66.0 in | Wt 203.0 lb

## 2024-09-24 DIAGNOSIS — E785 Hyperlipidemia, unspecified: Secondary | ICD-10-CM

## 2024-09-24 DIAGNOSIS — J309 Allergic rhinitis, unspecified: Secondary | ICD-10-CM

## 2024-09-24 DIAGNOSIS — I10 Essential (primary) hypertension: Secondary | ICD-10-CM

## 2024-09-24 DIAGNOSIS — N401 Enlarged prostate with lower urinary tract symptoms: Secondary | ICD-10-CM | POA: Diagnosis not present

## 2024-09-24 DIAGNOSIS — G4733 Obstructive sleep apnea (adult) (pediatric): Secondary | ICD-10-CM | POA: Diagnosis not present

## 2024-09-24 LAB — COMPREHENSIVE METABOLIC PANEL WITH GFR
ALT: 24 U/L (ref 3–53)
AST: 18 U/L (ref 5–37)
Albumin: 4.4 g/dL (ref 3.5–5.2)
Alkaline Phosphatase: 79 U/L (ref 39–117)
BUN: 11 mg/dL (ref 6–23)
CO2: 30 meq/L (ref 19–32)
Calcium: 9.1 mg/dL (ref 8.4–10.5)
Chloride: 104 meq/L (ref 96–112)
Creatinine, Ser: 1.04 mg/dL (ref 0.40–1.50)
GFR: 74.96 mL/min
Glucose, Bld: 99 mg/dL (ref 70–99)
Potassium: 4.4 meq/L (ref 3.5–5.1)
Sodium: 141 meq/L (ref 135–145)
Total Bilirubin: 0.8 mg/dL (ref 0.2–1.2)
Total Protein: 6.9 g/dL (ref 6.0–8.3)

## 2024-09-24 LAB — LIPID PANEL
Cholesterol: 201 mg/dL — ABNORMAL HIGH (ref 28–200)
HDL: 53.9 mg/dL
LDL Cholesterol: 126 mg/dL — ABNORMAL HIGH (ref 10–99)
NonHDL: 147.06
Total CHOL/HDL Ratio: 4
Triglycerides: 107 mg/dL (ref 10.0–149.0)
VLDL: 21.4 mg/dL (ref 0.0–40.0)

## 2024-09-24 NOTE — Assessment & Plan Note (Signed)
 Stable bp on amlodipine . Continue same.

## 2024-09-24 NOTE — Assessment & Plan Note (Signed)
Continues cpap.  

## 2024-09-24 NOTE — Patient Instructions (Signed)
" °  VISIT SUMMARY: Joel Chen, a 69 year old male with hypertension, came in for a follow-up on blood pressure management. He is currently taking amlodipine  and quinapril  for hypertension and feels well on these medications. He also uses Flomax  for urinary symptoms and Nuvessa for sinusitis. He manages his allergies with a CPAP machine at night. He discussed his cholesterol management and is considering natural methods such as diet and exercise. He has been more physically active due to caring for his wife, who has had significant health issues.  YOUR PLAN: -PRIMARY HYPERTENSION: Primary hypertension is high blood pressure with no identifiable cause. Your blood pressure is well controlled at 139/75 mmHg with your current medications, amlodipine  and quinapril . Continue taking amlodipine  5 mg orally daily.  -HYPERLIPIDEMIA: Hyperlipidemia is having high levels of cholesterol in the blood, which increases the risk of cardiovascular events. Your ten-year cardiovascular event risk is 18.9%. We have ordered a cholesterol panel to check your levels and encourage you to continue with lifestyle modifications, including diet and exercise, to manage your cholesterol naturally.  -BENIGN PROSTATIC HYPERPLASIA WITH LOWER URINARY TRACT SYMPTOMS: Benign prostatic hyperplasia is an enlarged prostate gland that can cause urinary symptoms. Continue taking tamsulosin  0.4 mg orally daily to manage these symptoms.  INSTRUCTIONS: Please follow up with the ordered cholesterol panel and continue with the lifestyle modifications discussed. Continue taking your medications as prescribed.                     "

## 2024-09-24 NOTE — Assessment & Plan Note (Signed)
 Bp stable on flomax . Continue same.

## 2024-09-24 NOTE — Assessment & Plan Note (Signed)
 Uses navage. Not needing singulair  this time of year.

## 2024-09-24 NOTE — Assessment & Plan Note (Addendum)
 Lab Results  Component Value Date   CHOL 216 (H) 09/20/2023   HDL 57.70 09/20/2023   LDLCALC 135 (H) 09/20/2023   LDLDIRECT 124.0 10/16/2020   TRIG 113.0 09/20/2023   CHOLHDL 4 09/20/2023   The 10-year ASCVD risk score (Arnett DK, et al., 2019) is: 18.9%   Values used to calculate the score:     Age: 69 years     Clinically relevant sex: Male     Is Non-Hispanic African American: Yes     Diabetic: No     Tobacco smoker: No     Systolic Blood Pressure: 139 mmHg     Is BP treated: Yes     HDL Cholesterol: 57.7 mg/dL     Total Cholesterol: 216 mg/dL   Ten-year cardiovascular event risk 18.9%. Benefits from cholesterol-lowering medication. Prefers natural methods. - Ordered cholesterol panel. - Encouraged lifestyle modifications including diet and exercise.

## 2024-09-24 NOTE — Progress Notes (Signed)
 "  Subjective:     Patient ID: Joel Chen, male    DOB: Dec 10, 1955, 69 y.o.   MRN: [id number]  Chief Complaint  Patient presents with   Hypertension    Here for follow up    Hypertension    Discussed the use of AI scribe software for clinical note transcription with the patient, who gave verbal consent to proceed.  History of Present Illness Joel Chen is a 69 year old male with hypertension who presents for a follow-up on blood pressure management.  He is currently taking amlodipine  5 mg and quinapril  10 mg for hypertension. He feels well on these medications.  He uses Flomax  for urinary symptoms and reports no issues with urination. He recently used the restroom and reports no issues with urination.  He has sinusitis and uses Nuvessa to manage his symptoms. He does not currently use Singulair  for allergies but manages his symptoms with a CPAP machine at night, which helps him breathe better.  He discusses his cholesterol management, noting that he previously tried using garlic supplements on his own. He is considering natural methods to manage his cholesterol, including diet and exercise.  His wife has had significant health issues, including heart valve replacement, pacemaker insertion, and breast cancer treatment. He has been actively involved in her care, which has increased his physical activity through walking.   Health Maintenance Due  Topic Date Due   COVID-19 Vaccine (8 - 2025-26 season) 05/13/2024    Past Medical History:  Diagnosis Date   Chicken pox    Hyperlipidemia 04/29/2011   Hypertension    Mumps    Personal history of hyperthyroidism    pt states his hyperthyroidism went away and hasn't had to take meds in 8 yrs.    Past Surgical History:  Procedure Laterality Date   COLONOSCOPY  09/2009   hemorrhoids   ROOT CANAL  1998   WISDOM TOOTH EXTRACTION  1994    Family History  Problem Relation Age of Onset   Heart disease Mother         Living   Hypertension Mother    Cancer Father 52       lung-Deceased   Healthy Sister        x2   Other Brother        Hyperglycemia/Pre-diabetes   Healthy Brother        x1 [#2]   Heart disease Maternal Aunt    Hypertension Maternal Aunt    Dementia Maternal Grandfather    Allergies Daughter    Alpha-1 antitrypsin deficiency Neg Hx    Liver disease Neg Hx     Social History   Socioeconomic History   Marital status: Married    Spouse name: Not on file   Number of children: 2   Years of education: Not on file   Highest education level: Associate degree: occupational, scientist, product/process development, or vocational program  Occupational History   Occupation: DJ    Comment: self-employed  Tobacco Use   Smoking status: Never    Passive exposure: Yes   Smokeless tobacco: Never  Substance and Sexual Activity   Alcohol use: Yes    Alcohol/week: 2.0 standard drinks of alcohol    Types: 2 Cans of beer per week    Comment: couple a week   Drug use: No   Sexual activity: Yes    Partners: Female  Other Topics Concern   Not on file  Social History Narrative   Regular exercise:  Active at work. Walks 2 x weekly.   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   Has another daughter               Social Drivers of Health   Tobacco Use: Medium Risk (07/10/2024)   Patient History    Smoking Tobacco Use: Never    Smokeless Tobacco Use: Never    Passive Exposure: Yes  Financial Resource Strain: Medium Risk (09/24/2024)   Overall Financial Resource Strain (CARDIA)    Difficulty of Paying Living Expenses: Somewhat hard  Food Insecurity: Food Insecurity Present (09/24/2024)   Epic    Worried About Programme Researcher, Broadcasting/film/video in the Last Year: Patient declined    Barista in the Last Year: Sometimes true  Transportation Needs: No Transportation Needs (09/24/2024)   Epic    Lack of Transportation (Medical): No    Lack of Transportation  (Non-Medical): No  Physical Activity: Insufficiently Active (09/24/2024)   Exercise Vital Sign    Days of Exercise per Week: 4 days    Minutes of Exercise per Session: 30 min  Stress: No Stress Concern Present (09/24/2024)   [employer] of Occupational Health - Occupational Stress Questionnaire    Feeling of Stress: Only a little  Social Connections: Moderately Integrated (09/24/2024)   Social Connection and Isolation Panel    Frequency of Communication with Friends and Family: More than three times a week    Frequency of Social Gatherings with Friends and Family: Three times a week    Attends Religious Services: More than 4 times per year    Active Member of Clubs or Organizations: No    Attends Banker Meetings: Not on file    Marital Status: Married  Intimate Partner Violence: Not At Risk (07/10/2024)   Epic    Fear of Current or Ex-Partner: No    Emotionally Abused: No    Physically Abused: No    Sexually Abused: No  Depression (PHQ2-9): Low Risk (09/24/2024)   Depression (PHQ2-9)    PHQ-2 Score: 0  Alcohol Screen: Low Risk (09/24/2024)   Alcohol Screen    Last Alcohol Screening Score (AUDIT): 2  Housing: Low Risk (09/24/2024)   Epic    Unable to Pay for Housing in the Last Year: No    Number of Times Moved in the Last Year: 0    Homeless in the Last Year: No  Utilities: Not At Risk (07/10/2024)   Epic    Threatened with loss of utilities: No  Health Literacy: Adequate Health Literacy (07/10/2024)   B1300 Health Literacy    Frequency of need for help with medical instructions: Never    Outpatient Medications Prior to Visit  Medication Sig Dispense Refill   amLODipine  (NORVASC ) 5 MG tablet Take 1 tablet (5 mg total) by mouth daily. 90 tablet 1   montelukast  (SINGULAIR ) 10 MG tablet Take 1 tablet (10 mg total) by mouth daily. 90 tablet 1   tadalafil  (CIALIS ) 20 MG tablet Take one tablet 2-3 hrs prior to sexual activity no more than every other day. 30  tablet 3   tamsulosin  (FLOMAX ) 0.4 MG CAPS capsule Take 1 capsule (0.4 mg total) by mouth daily. 90 capsule 3   No facility-administered medications prior to visit.    Allergies[1]  ROS See HPI    Objective:    Physical Exam Constitutional:      General:  He is not in acute distress.    Appearance: He is well-developed.  HENT:     Head: Normocephalic and atraumatic.  Cardiovascular:     Rate and Rhythm: Normal rate and regular rhythm.     Heart sounds: No murmur heard. Pulmonary:     Effort: Pulmonary effort is normal. No respiratory distress.     Breath sounds: Normal breath sounds. No wheezing or rales.  Skin:    General: Skin is warm and dry.  Neurological:     Mental Status: He is alert and oriented to person, place, and time.  Psychiatric:        Behavior: Behavior normal.        Thought Content: Thought content normal.      BP 139/75 (BP Location: Right Arm, Patient Position: Sitting, Cuff Size: Normal)   Pulse 65   Temp 98 F (36.7 C) (Oral)   Resp 16   Ht 5' 6 (1.676 m)   Wt 203 lb (92.1 kg)   SpO2 100%   BMI 32.77 kg/m  Wt Readings from Last 3 Encounters:  09/24/24 203 lb (92.1 kg)  07/10/24 198 lb (89.8 kg)  05/23/24 200 lb (90.7 kg)       Assessment & Plan:   Problem List Items Addressed This Visit       Unprioritized   OSA (obstructive sleep apnea)   Continues cpap.        Hypertension   Stable bp on amlodipine . Continue same.       Relevant Orders   Comp Met (CMET)   Hyperlipidemia   Lab Results  Component Value Date   CHOL 216 (H) 09/20/2023   HDL 57.70 09/20/2023   LDLCALC 135 (H) 09/20/2023   LDLDIRECT 124.0 10/16/2020   TRIG 113.0 09/20/2023   CHOLHDL 4 09/20/2023   The 10-year ASCVD risk score (Arnett DK, et al., 2019) is: 18.9%   Values used to calculate the score:     Age: 38 years     Clinically relevant sex: Male     Is Non-Hispanic African American: Yes     Diabetic: No     Tobacco smoker: No     Systolic  Blood Pressure: 139 mmHg     Is BP treated: Yes     HDL Cholesterol: 57.7 mg/dL     Total Cholesterol: 216 mg/dL   Ten-year cardiovascular event risk 18.9%. Benefits from cholesterol-lowering medication. Prefers natural methods. - Ordered cholesterol panel. - Encouraged lifestyle modifications including diet and exercise.       Relevant Orders   Lipid panel   Benign prostatic hyperplasia with lower urinary tract symptoms - Primary   Bp stable on flomax . Continue same.       Allergic rhinitis   Uses navage. Not needing singulair  this time of year.       Assessment & Plan     I am having Tanda CHARLENA Blush maintain his tadalafil , tamsulosin , amLODipine , and montelukast .  No orders of the defined types were placed in this encounter.     [1] No Known Allergies  "

## 2024-09-26 ENCOUNTER — Ambulatory Visit: Payer: Self-pay | Admitting: Family

## 2024-11-13 ENCOUNTER — Other Ambulatory Visit

## 2024-11-20 ENCOUNTER — Ambulatory Visit: Admitting: Urology

## 2025-03-25 ENCOUNTER — Ambulatory Visit: Admitting: Family

## 2025-07-11 ENCOUNTER — Ambulatory Visit
